# Patient Record
Sex: Male | Born: 1940 | Race: White | Hispanic: No | Marital: Married | Smoking: Former smoker
Health system: Southern US, Community
[De-identification: ages and names within clinical notes are randomized; demographics above are authoritative.]

## PROBLEM LIST (undated history)

## (undated) DIAGNOSIS — G4730 Sleep apnea, unspecified: Secondary | ICD-10-CM

## (undated) DIAGNOSIS — I10 Essential (primary) hypertension: Secondary | ICD-10-CM

## (undated) HISTORY — PX: CHOLECYSTECTOMY: SHX55

## (undated) HISTORY — PX: BACK SURGERY: SHX140

---

## 1998-04-13 ENCOUNTER — Other Ambulatory Visit: Admission: RE | Admit: 1998-04-13 | Discharge: 1998-04-13 | Payer: Self-pay

## 2004-03-07 ENCOUNTER — Ambulatory Visit (HOSPITAL_COMMUNITY): Admission: RE | Admit: 2004-03-07 | Discharge: 2004-03-07 | Payer: Self-pay | Admitting: Gastroenterology

## 2009-12-06 ENCOUNTER — Inpatient Hospital Stay (HOSPITAL_COMMUNITY): Admission: RE | Admit: 2009-12-06 | Discharge: 2009-12-08 | Payer: Self-pay | Admitting: Neurosurgery

## 2010-01-05 ENCOUNTER — Encounter: Admission: RE | Admit: 2010-01-05 | Discharge: 2010-01-05 | Payer: Self-pay | Admitting: Neurosurgery

## 2010-04-19 ENCOUNTER — Ambulatory Visit
Admission: RE | Admit: 2010-04-19 | Discharge: 2010-04-19 | Disposition: A | Discharge status: Home / Self Care | Payer: Medicare Other | Source: Ambulatory Visit | Attending: Neurosurgery | Admitting: Neurosurgery

## 2010-04-19 ENCOUNTER — Other Ambulatory Visit: Payer: Self-pay | Admitting: Neurosurgery

## 2010-04-19 DIAGNOSIS — M549 Dorsalgia, unspecified: Secondary | ICD-10-CM

## 2010-04-27 LAB — SURGICAL PCR SCREEN
MRSA, PCR: NEGATIVE
Staphylococcus aureus: POSITIVE — AB

## 2010-04-27 LAB — DIFFERENTIAL
Basophils Relative: 0 % (ref 0–1)
Eosinophils Relative: 3 % (ref 0–5)
Lymphocytes Relative: 32 % (ref 12–46)
Neutro Abs: 3.1 10*3/uL (ref 1.7–7.7)
Neutrophils Relative %: 59 % (ref 43–77)

## 2010-04-27 LAB — BASIC METABOLIC PANEL
CO2: 28 mEq/L (ref 19–32)
Calcium: 9.6 mg/dL (ref 8.4–10.5)
GFR calc Af Amer: >60 mL/min (ref >60)
GFR calc non Af Amer: >60 mL/min (ref >60)
Glucose, Bld: 96 mg/dL (ref 70–99)
Potassium: 3.7 mEq/L (ref 3.5–5.1)
Sodium: 142 mEq/L (ref 135–145)

## 2010-04-27 LAB — CBC
HCT: 43.6 % (ref 39.0–52.0)
Hemoglobin: 15.3 g/dL (ref 13.0–17.0)
MCHC: 35.1 g/dL (ref 30.0–36.0)
RBC: 4.78 MIL/uL (ref 4.22–5.81)

## 2010-04-27 LAB — TYPE AND SCREEN: Antibody Screen: NEGATIVE

## 2010-07-01 NOTE — Op Note (Signed)
NAMEDARRILL, VREELAND                   ACCOUNT NO.:  [id number]   MEDICAL RECORD NO.:  [id number]          PATIENT TYPE:  AMB   LOCATION:  ENDO                         FACILITY:  [facility]   PHYSICIAN:  Anselmo Rod, M.D.  DATE OF BIRTH:  08-07-1940   DATE OF PROCEDURE:  03/07/2004  DATE OF DISCHARGE:                                 OPERATIVE REPORT   PROCEDURE PERFORMED:  Screening colonoscopy.   ENDOSCOPIST:  Anselmo Rod, M.D.   INSTRUMENT USED:  Olympus video colonoscope.   INDICATIONS FOR PROCEDURE:  This 70 year old white male underwent screening  colonoscopy to rule out colonic polyps, masses, etc.   PRE-PROCEDURE PREPARATION:  Informed consent was procured from the patient.  The patient was fasted for eight hours prior to the procedure and prepped  with a bottle of magnesium citrate and a gallon of GoLYTELY the night prior  to the procedure.  The risks and benefits of the procedure, including a 10%  miss rate of cancer and polyps were discussed with the patient as well.   PRE-PROCEDURE PHYSICAL:  VITAL SIGNS:  The patient had stable vital signs.  NECK:  Supple.  CHEST:  Clear to auscultation.  CARDIOVASCULAR:  S1, S2 regular.  ABDOMEN:  Soft, with normal bowel sounds.   DESCRIPTION OF THE PROCEDURE:  The patient was placed in the left lateral  decubitus position, sedated with 100 mg of Demerol and 7.5 mg of Versed in  slow incremental doses.  Once the patient was adequately sedated and  maintained on low-flow oxygen and continuous cardiac monitoring, the Olympus  video colonoscope was advanced from the rectum to the cecum.  The  appendiceal orifice and the ileocecal valve were clearly visualized and  photographed.  No masses polyps, polyps, erosions, or ulcerations were  noted.  There was evidence of early sigmoid diverticulosis.  Retroflexion in  the rectum revealed prominent internal hemorrhoids, with a prominent anal  papilla.  The patient tolerated the procedure  well, without complications.   IMPRESSION:  1.  Prominent internal hemorrhoid with anal papilla seen on retroflexion.  2.  Scattered early sigmoid diverticulosis.  3.  No masses or polyps seen.   RECOMMENDATIONS:  1.  Continue a high-fiber diet, with liberal fluid intake.  2.  Repeat colonoscopy in the next five years unless the patient develops      any abnormal symptoms in the interim.  3.  Outpatient followup as need arises in the future.      JNM/MEDQ  D:  03/07/2004  T:  03/07/2004  Job:  [id number]   cc:   Olene Craven, M.D.  [address]  [address]  [place]  [place] [address]  Fax: [phone]

## 2010-12-15 ENCOUNTER — Ambulatory Visit
Admission: RE | Admit: 2010-12-15 | Discharge: 2010-12-15 | Disposition: A | Discharge status: Home / Self Care | Payer: Medicare Other | Source: Ambulatory Visit | Attending: Neurosurgery | Admitting: Neurosurgery

## 2010-12-15 ENCOUNTER — Other Ambulatory Visit: Payer: Self-pay | Admitting: Neurosurgery

## 2010-12-15 DIAGNOSIS — M545 Low back pain: Secondary | ICD-10-CM

## 2011-12-13 ENCOUNTER — Encounter (INDEPENDENT_AMBULATORY_CARE_PROVIDER_SITE_OTHER): Payer: Medicare Other | Admitting: Ophthalmology

## 2011-12-13 DIAGNOSIS — H2510 Age-related nuclear cataract, unspecified eye: Secondary | ICD-10-CM

## 2011-12-13 DIAGNOSIS — H33309 Unspecified retinal break, unspecified eye: Secondary | ICD-10-CM

## 2011-12-13 DIAGNOSIS — H35039 Hypertensive retinopathy, unspecified eye: Secondary | ICD-10-CM

## 2011-12-13 DIAGNOSIS — H43819 Vitreous degeneration, unspecified eye: Secondary | ICD-10-CM

## 2011-12-13 DIAGNOSIS — I10 Essential (primary) hypertension: Secondary | ICD-10-CM

## 2011-12-13 DIAGNOSIS — H4310 Vitreous hemorrhage, unspecified eye: Secondary | ICD-10-CM

## 2011-12-20 ENCOUNTER — Ambulatory Visit (INDEPENDENT_AMBULATORY_CARE_PROVIDER_SITE_OTHER): Payer: Medicare Other | Admitting: Ophthalmology

## 2011-12-20 DIAGNOSIS — H33309 Unspecified retinal break, unspecified eye: Secondary | ICD-10-CM

## 2012-04-11 ENCOUNTER — Ambulatory Visit (INDEPENDENT_AMBULATORY_CARE_PROVIDER_SITE_OTHER): Payer: Medicare Other | Admitting: Ophthalmology

## 2012-04-11 DIAGNOSIS — H2510 Age-related nuclear cataract, unspecified eye: Secondary | ICD-10-CM

## 2012-04-11 DIAGNOSIS — I10 Essential (primary) hypertension: Secondary | ICD-10-CM

## 2012-04-11 DIAGNOSIS — H35039 Hypertensive retinopathy, unspecified eye: Secondary | ICD-10-CM

## 2012-04-11 DIAGNOSIS — H33309 Unspecified retinal break, unspecified eye: Secondary | ICD-10-CM

## 2012-04-11 DIAGNOSIS — H43819 Vitreous degeneration, unspecified eye: Secondary | ICD-10-CM

## 2012-04-11 DIAGNOSIS — H40019 Open angle with borderline findings, low risk, unspecified eye: Secondary | ICD-10-CM

## 2012-04-18 ENCOUNTER — Ambulatory Visit (INDEPENDENT_AMBULATORY_CARE_PROVIDER_SITE_OTHER): Payer: Medicare Other | Admitting: Ophthalmology

## 2012-12-27 ENCOUNTER — Other Ambulatory Visit: Payer: Self-pay | Admitting: Specialist

## 2012-12-27 DIAGNOSIS — H547 Unspecified visual loss: Secondary | ICD-10-CM

## 2012-12-27 DIAGNOSIS — H40053 Ocular hypertension, bilateral: Secondary | ICD-10-CM

## 2013-01-07 ENCOUNTER — Ambulatory Visit
Admission: RE | Admit: 2013-01-07 | Discharge: 2013-01-07 | Disposition: A | Discharge status: Home / Self Care | Payer: Medicare Other | Source: Ambulatory Visit | Attending: Specialist | Admitting: Specialist

## 2013-01-07 DIAGNOSIS — H547 Unspecified visual loss: Secondary | ICD-10-CM

## 2013-01-07 DIAGNOSIS — H40053 Ocular hypertension, bilateral: Secondary | ICD-10-CM

## 2013-01-07 MED ORDER — GADOBENATE DIMEGLUMINE 529 MG/ML IV SOLN
20.0000 mL | Freq: Once | INTRAVENOUS | Status: AC | PRN
  Administered 2013-01-07: 20 mL via INTRAVENOUS

## 2013-03-26 IMAGING — CR DG LUMBAR SPINE 1V
2 series · 2 of 2 positions shown · non-contrast
Comparison: Prior study 04/19/2010.

CLINICAL DATA: Back pain.  History of lumbar fusion.

LUMBAR SPINE - 1 VIEW

[view not recorded (1 of 2)]
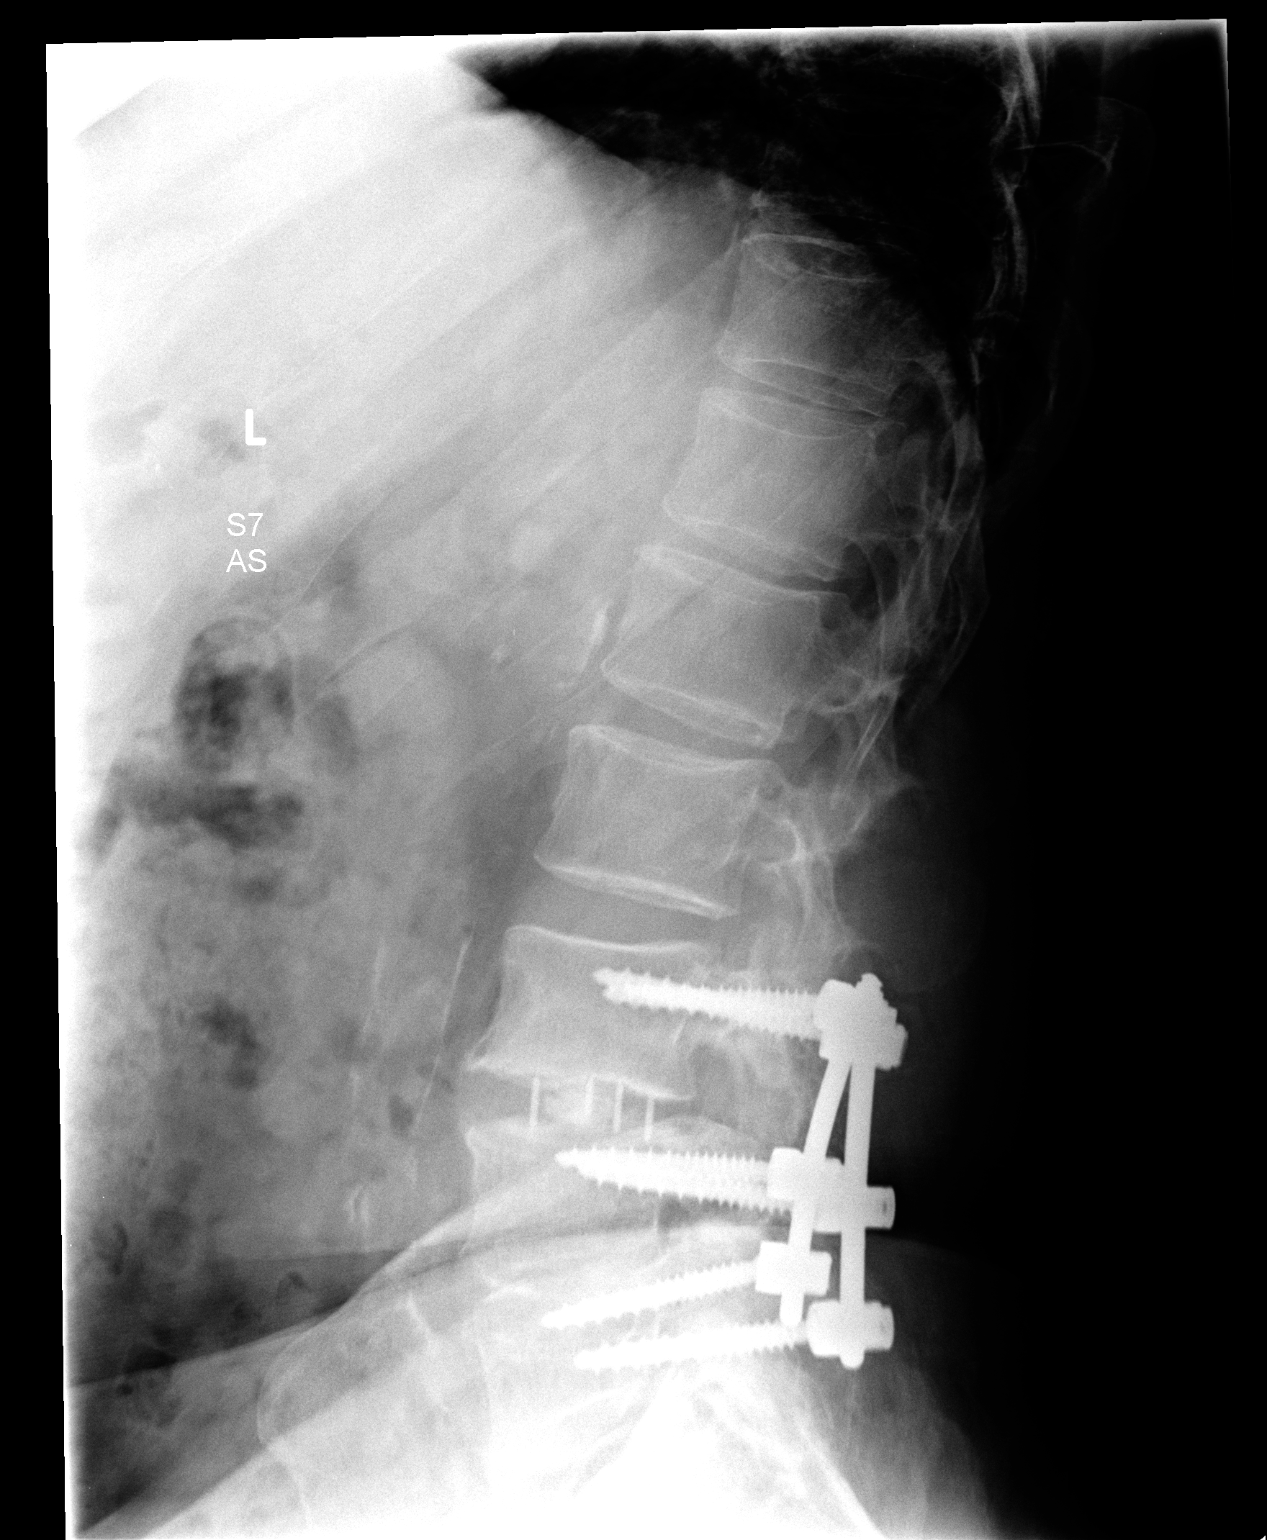

[view not recorded (2 of 2)]
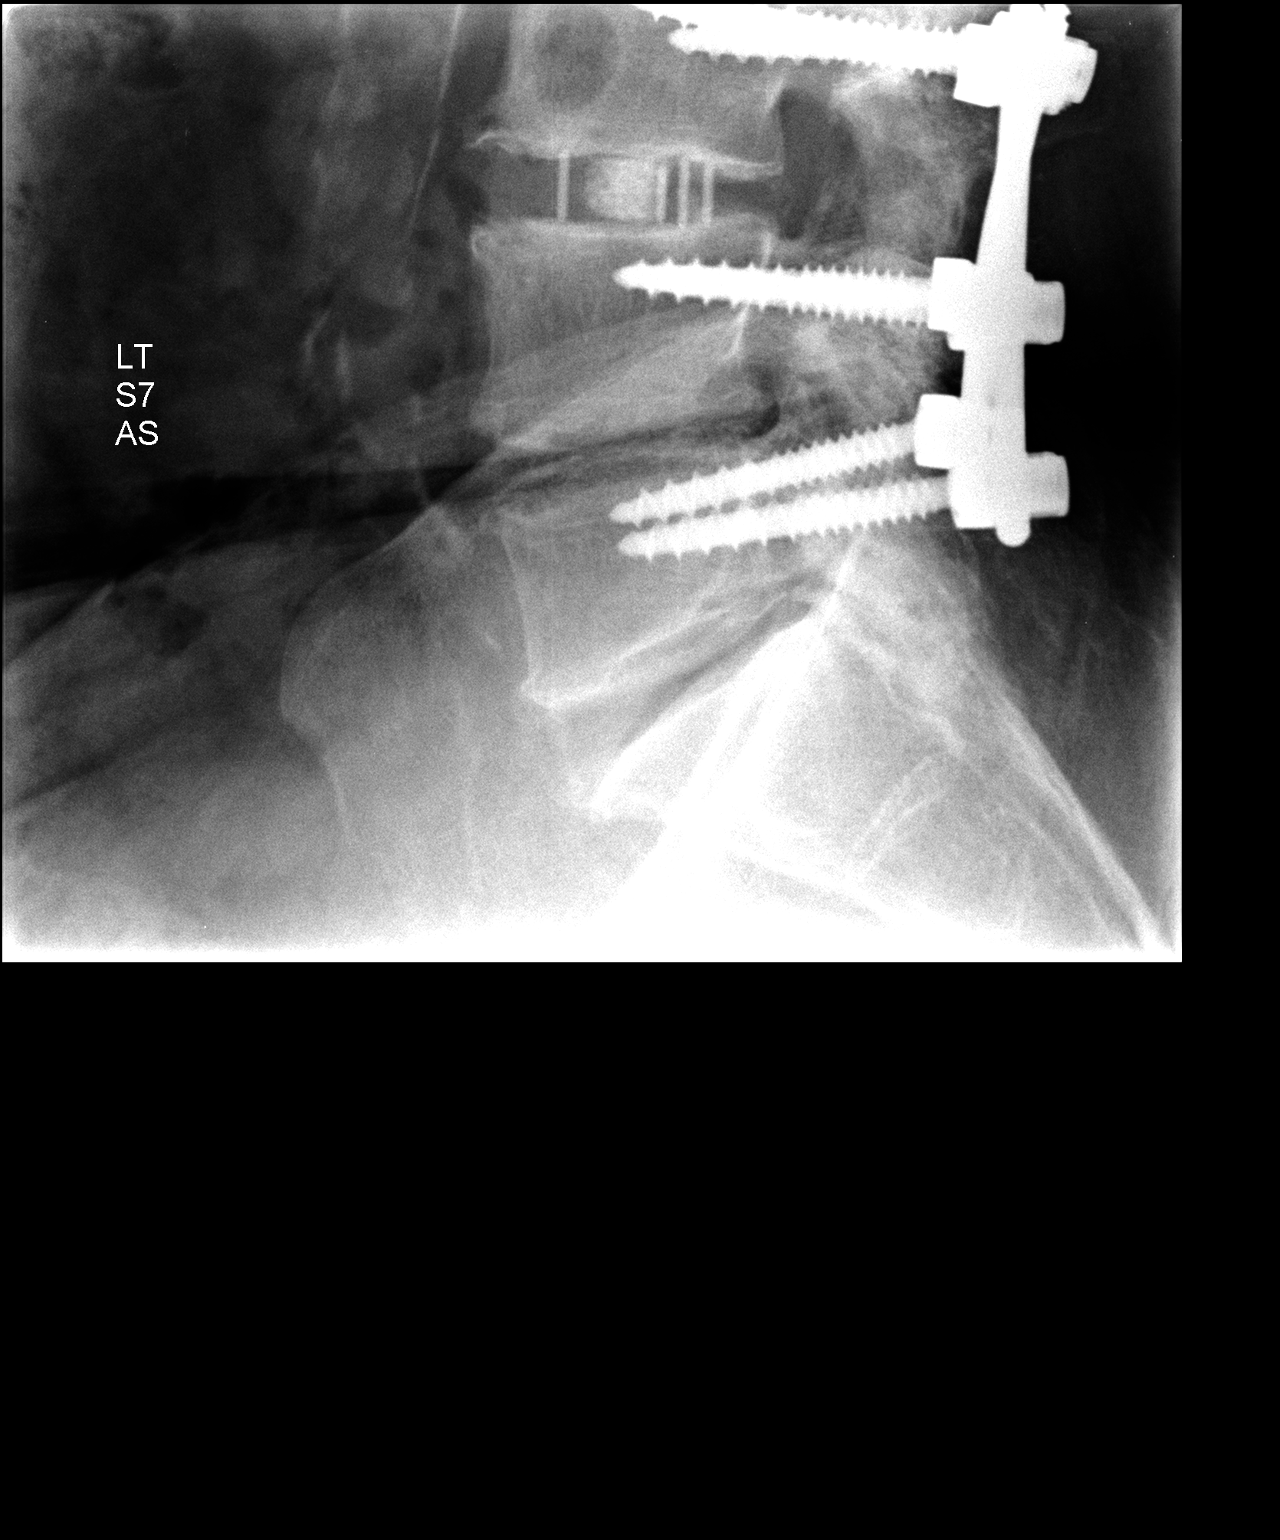

[2 of 2 positions shown; findings below may reference images not displayed]

FINDINGS: Stable appearance and position of the fusion hardware at
L3-4 and L4-5.  Interbody bone spacer noted at L3-4.  Stable
degenerative disc disease at L5-S1.
IMPRESSION: Stable position and appearance of the lumbar fusion hardware.

## 2013-10-22 DIAGNOSIS — E785 Hyperlipidemia, unspecified: Secondary | ICD-10-CM | POA: Insufficient documentation

## 2013-10-22 DIAGNOSIS — K219 Gastro-esophageal reflux disease without esophagitis: Secondary | ICD-10-CM | POA: Insufficient documentation

## 2013-10-22 DIAGNOSIS — D1801 Hemangioma of skin and subcutaneous tissue: Secondary | ICD-10-CM | POA: Insufficient documentation

## 2013-10-22 DIAGNOSIS — M810 Age-related osteoporosis without current pathological fracture: Secondary | ICD-10-CM | POA: Insufficient documentation

## 2013-10-22 DIAGNOSIS — M79673 Pain in unspecified foot: Secondary | ICD-10-CM | POA: Insufficient documentation

## 2013-10-22 DIAGNOSIS — J301 Allergic rhinitis due to pollen: Secondary | ICD-10-CM | POA: Insufficient documentation

## 2013-10-22 DIAGNOSIS — G4700 Insomnia, unspecified: Secondary | ICD-10-CM | POA: Insufficient documentation

## 2013-10-22 DIAGNOSIS — R0982 Postnasal drip: Secondary | ICD-10-CM | POA: Insufficient documentation

## 2013-10-22 DIAGNOSIS — K5790 Diverticulosis of intestine, part unspecified, without perforation or abscess without bleeding: Secondary | ICD-10-CM | POA: Insufficient documentation

## 2013-10-22 DIAGNOSIS — R351 Nocturia: Secondary | ICD-10-CM | POA: Insufficient documentation

## 2013-10-22 DIAGNOSIS — G4733 Obstructive sleep apnea (adult) (pediatric): Secondary | ICD-10-CM | POA: Insufficient documentation

## 2013-10-22 DIAGNOSIS — K8050 Calculus of bile duct without cholangitis or cholecystitis without obstruction: Secondary | ICD-10-CM | POA: Insufficient documentation

## 2013-10-22 DIAGNOSIS — B351 Tinea unguium: Secondary | ICD-10-CM | POA: Insufficient documentation

## 2013-10-22 DIAGNOSIS — D229 Melanocytic nevi, unspecified: Secondary | ICD-10-CM | POA: Insufficient documentation

## 2013-10-22 DIAGNOSIS — I10 Essential (primary) hypertension: Secondary | ICD-10-CM | POA: Insufficient documentation

## 2013-10-22 DIAGNOSIS — N400 Enlarged prostate without lower urinary tract symptoms: Secondary | ICD-10-CM | POA: Insufficient documentation

## 2013-10-22 DIAGNOSIS — B078 Other viral warts: Secondary | ICD-10-CM | POA: Insufficient documentation

## 2013-10-22 DIAGNOSIS — E559 Vitamin D deficiency, unspecified: Secondary | ICD-10-CM | POA: Insufficient documentation

## 2013-10-22 DIAGNOSIS — R5383 Other fatigue: Secondary | ICD-10-CM | POA: Insufficient documentation

## 2013-10-22 DIAGNOSIS — R972 Elevated prostate specific antigen [PSA]: Secondary | ICD-10-CM | POA: Insufficient documentation

## 2013-10-22 DIAGNOSIS — K648 Other hemorrhoids: Secondary | ICD-10-CM | POA: Insufficient documentation

## 2013-10-22 DIAGNOSIS — Z9889 Other specified postprocedural states: Secondary | ICD-10-CM | POA: Insufficient documentation

## 2013-10-22 DIAGNOSIS — K8020 Calculus of gallbladder without cholecystitis without obstruction: Secondary | ICD-10-CM | POA: Insufficient documentation

## 2013-10-22 DIAGNOSIS — J309 Allergic rhinitis, unspecified: Secondary | ICD-10-CM | POA: Insufficient documentation

## 2013-10-22 DIAGNOSIS — R350 Frequency of micturition: Secondary | ICD-10-CM | POA: Insufficient documentation

## 2014-01-24 DIAGNOSIS — S161XXA Strain of muscle, fascia and tendon at neck level, initial encounter: Secondary | ICD-10-CM | POA: Insufficient documentation

## 2014-12-29 DIAGNOSIS — H0230 Blepharochalasis unspecified eye, unspecified eyelid: Secondary | ICD-10-CM | POA: Insufficient documentation

## 2014-12-29 DIAGNOSIS — H019 Unspecified inflammation of eyelid: Secondary | ICD-10-CM | POA: Insufficient documentation

## 2014-12-29 DIAGNOSIS — H02831 Dermatochalasis of right upper eyelid: Secondary | ICD-10-CM | POA: Insufficient documentation

## 2014-12-29 DIAGNOSIS — H01003 Unspecified blepharitis right eye, unspecified eyelid: Secondary | ICD-10-CM | POA: Insufficient documentation

## 2017-05-29 DIAGNOSIS — N401 Enlarged prostate with lower urinary tract symptoms: Secondary | ICD-10-CM | POA: Insufficient documentation

## 2020-09-29 DIAGNOSIS — Z6837 Body mass index (BMI) 37.0-37.9, adult: Secondary | ICD-10-CM | POA: Insufficient documentation

## 2021-08-22 ENCOUNTER — Ambulatory Visit (HOSPITAL_BASED_OUTPATIENT_CLINIC_OR_DEPARTMENT_OTHER)
Admission: RE | Admit: 2021-08-22 | Discharge: 2021-08-22 | Disposition: A | Discharge status: Home / Self Care | Payer: Medicare PPO | Source: Ambulatory Visit | Attending: Hematology & Oncology | Admitting: Hematology & Oncology

## 2021-08-22 ENCOUNTER — Encounter: Payer: Self-pay | Admitting: Hematology & Oncology

## 2021-08-22 ENCOUNTER — Inpatient Hospital Stay (HOSPITAL_BASED_OUTPATIENT_CLINIC_OR_DEPARTMENT_OTHER): Payer: Medicare PPO | Admitting: Hematology & Oncology

## 2021-08-22 ENCOUNTER — Other Ambulatory Visit: Payer: Self-pay

## 2021-08-22 ENCOUNTER — Inpatient Hospital Stay: Payer: Medicare PPO | Attending: Hematology & Oncology

## 2021-08-22 VITALS — BP 121/66 | HR 68 | Temp 97.6°F | Resp 18 | Ht 71.0 in | Wt 263.0 lb

## 2021-08-22 DIAGNOSIS — D472 Monoclonal gammopathy: Secondary | ICD-10-CM | POA: Insufficient documentation

## 2021-08-22 DIAGNOSIS — G63 Polyneuropathy in diseases classified elsewhere: Secondary | ICD-10-CM | POA: Insufficient documentation

## 2021-08-22 DIAGNOSIS — Z79899 Other long term (current) drug therapy: Secondary | ICD-10-CM

## 2021-08-22 LAB — CBC WITH DIFFERENTIAL (CANCER CENTER ONLY)
Abs Immature Granulocytes: 0.02 10*3/uL (ref 0.00–0.07)
Basophils Absolute: 0 10*3/uL (ref 0.0–0.1)
Basophils Relative: 1 %
Eosinophils Absolute: 0.1 10*3/uL (ref 0.0–0.5)
Eosinophils Relative: 1 %
HCT: 41.7 % (ref 39.0–52.0)
Hemoglobin: 14.2 g/dL (ref 13.0–17.0)
Immature Granulocytes: 0 %
Lymphocytes Relative: 19 %
Lymphs Abs: 1.4 10*3/uL (ref 0.7–4.0)
MCH: 30.2 pg (ref 26.0–34.0)
MCHC: 34.1 g/dL (ref 30.0–36.0)
MCV: 88.7 fL (ref 80.0–100.0)
Monocytes Absolute: 0.3 10*3/uL (ref 0.1–1.0)
Monocytes Relative: 5 %
Neutro Abs: 5.3 10*3/uL (ref 1.7–7.7)
Neutrophils Relative %: 74 %
Platelet Count: 255 10*3/uL (ref 150–400)
RBC: 4.7 MIL/uL (ref 4.22–5.81)
RDW: 13.4 % (ref 11.5–15.5)
WBC Count: 7.1 10*3/uL (ref 4.0–10.5)
nRBC: 0 % (ref 0.0–0.2)

## 2021-08-22 LAB — SAMPLE TO BLOOD BANK

## 2021-08-22 LAB — CMP (CANCER CENTER ONLY)
ALT: 16 U/L (ref 0–44)
AST: 20 U/L (ref 15–41)
Albumin: 3.7 g/dL (ref 3.5–5.0)
Alkaline Phosphatase: 70 U/L (ref 38–126)
Anion gap: 10 (ref 5–15)
BUN: 19 mg/dL (ref 8–23)
CO2: 27 mmol/L (ref 22–32)
Calcium: 8.9 mg/dL (ref 8.9–10.3)
Chloride: 101 mmol/L (ref 98–111)
Creatinine: 1.26 mg/dL — ABNORMAL HIGH (ref 0.61–1.24)
GFR, Estimated: 58 mL/min — ABNORMAL LOW (ref >60)
Glucose, Bld: 154 mg/dL — ABNORMAL HIGH (ref 70–99)
Potassium: 3.2 mmol/L — ABNORMAL LOW (ref 3.5–5.1)
Sodium: 138 mmol/L (ref 135–145)
Total Bilirubin: 0.8 mg/dL (ref 0.3–1.2)
Total Protein: 7 g/dL (ref 6.5–8.1)

## 2021-08-22 LAB — LACTATE DEHYDROGENASE: LDH: 122 U/L (ref 98–192)

## 2021-08-22 NOTE — Progress Notes (Signed)
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  HPI: Paul Cardenas is an incredibly interesting 81 year old white male.  He is originally from [place].  He actually went to the [employer].  He was in the seventh graduating class at the [employer].  He was an Actuary.  After the [employer], in which she was in for about 20 years, he went to work for [employer].  He is incredibly interesting to talk to.  He is followed by Dr. Orpah Greek.  She had noted that he is complaining of some numbness in the right foot.  He has she has had right foot surgery before.  He has a pin in the right foot.  She went ahead and did some labs on him.  She ultimately did a protein electrophoresis.  There is no comment as to any type of level of abnormal protein.  However, it was felt that a immunofixation need to be done.  This was done.  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  Of course, there is levels were too low to be measurable.  He had a urine done.  The 24-hour urine I think was unremarkable.  She subsequently referred him to the [facility] for an evaluation.  He has had no problem with infections.  He has had no cough.  Is been no swallowing difficulties.  He has had no headache.  He has had no nausea or vomiting.  There is no weight loss or weight gain.  He did smoke while in the service.  He started back in 1989.  I do not think he really has a significant adult beverage use.  There is prostate issues.  He is going to have a UroLift procedure this week.  As far as he knows, there is no history of blood problems in the family.  Currently, I would have to say that his performance status is probably ECOG 1.   History reviewed. No pertinent past  medical history.:  History reviewed. No pertinent surgical history.:   Current Outpatient Medications:    amLODipine (NORVASC) 5 MG tablet, Take 5 mg by mouth daily., Disp: , Rfl:    ezetimibe (ZETIA) 10 MG tablet, Take 10 mg by mouth daily., Disp: , Rfl:    finasteride (PROSCAR) 5 MG tablet, Take 5 mg by mouth daily., Disp: , Rfl:    gabapentin (NEURONTIN) 100 MG capsule, Take 100 mg by mouth daily., Disp: , Rfl:    losartan-hydrochlorothiazide (HYZAAR) 100-25 MG tablet, Take 1 tablet by mouth daily., Disp: , Rfl:    montelukast (SINGULAIR) 10 MG tablet, Take 10 mg by mouth daily., Disp: , Rfl:    tamsulosin (FLOMAX) 0.4 MG CAPS capsule, Take 0.4 mg by mouth daily., Disp: , Rfl: :  :   Allergies  Allergen Reactions   Statins Other (See Comments)    Muscle Cramps   :  History reviewed. No pertinent family history.:   Social History   Socioeconomic History   Marital status: Married    Spouse name: Not on file   Number of children: Not on file   Years of education: Not on file   Highest education level: Not on file  Occupational History   Not  on file  Tobacco Use   Smoking status: Former    Packs/day: 1.00    Types: Cigarettes    Start date: 21    Quit date: 1989    Years since quitting: 34.5   Smokeless tobacco: Never  Substance and Sexual Activity   Alcohol use: Not on file   Drug use: Not on file   Sexual activity: Not on file  Other Topics Concern   Not on file  Social History Narrative   Not on file   Social Determinants of Health   Financial Resource Strain: Not on file  Food Insecurity: Not on file  Transportation Needs: Not on file  Physical Activity: Not on file  Stress: Not on file  Social Connections: Not on file  Intimate Partner Violence: Not on file  :  Review of Systems  Constitutional: Negative.   HENT: Negative.    Eyes: Negative.   Respiratory: Negative.    Cardiovascular: Negative.   Gastrointestinal: Negative.    Genitourinary: Negative.   Musculoskeletal: Negative.   Skin: Negative.   Neurological:  Positive for tingling.  Endo/Heme/Allergies: Negative.   Psychiatric/Behavioral: Negative.       Exam: Vital signs are temperature of 97.6.  Pulse 68.  Blood pressure 121/66.  Weight is 263 pounds. _0 @ Physical Exam Vitals reviewed.  HENT:     Head: Normocephalic and atraumatic.  Eyes:     Pupils: Pupils are equal, round, and reactive to light.  Cardiovascular:     Rate and Rhythm: Normal rate and regular rhythm.     Heart sounds: Normal heart sounds.  Pulmonary:     Effort: Pulmonary effort is normal.     Breath sounds: Normal breath sounds.  Abdominal:     General: Bowel sounds are normal.     Palpations: Abdomen is soft.  Musculoskeletal:        General: No tenderness or deformity. Normal range of motion.     Cervical back: Normal range of motion.  Lymphadenopathy:     Cervical: No cervical adenopathy.  Skin:    General: Skin is warm and dry.     Findings: No erythema or rash.  Neurological:     Mental Status: He is alert and oriented to person, place, and time.  Psychiatric:        Behavior: Behavior normal.        Thought Content: Thought content normal.        Judgment: Judgment normal.     Recent Labs    08/22/21 1305  WBC 7.1  HGB 14.2  HCT 41.7  PLT 255    Recent Labs    08/22/21 1305  NA 138  K 3.2*  CL 101  CO2 27  GLUCOSE 154*  BUN 19  CREATININE 1.26*  CALCIUM 8.9    Blood smear review: Normochromic and normocytic population of red blood cells.  There are no nucleated red blood cells.  I see no rouleaux formation.  There are no schistocytes or spherocytes.  White blood cells appear normal in morphology and maturation.  There is no immature myeloid or lymphoid forms.  There are no plasma cells.  There is no hypersegmented polys.  Platelets are adequate in number and size.  Pathology: Pending    Assessment and Plan: Paul Cardenas is a very nice  81 year old white male.  He served our country in the [employer].  I think a somewhat for service.  He was very interesting to talk to about his  time in the [employer].  I just have a hard time believing that we are going to run into a problem with myeloma or maybe even smoldering myeloma.  I would have a hard time believing that numbness in the right foot is from a monoclonal gammopathy.  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  He said he had surgery on the right foot.  As such I will know if this might be a factor.  I will have to think that he would need to have nerve conduction studies done.  This I will leave up to his family doctor.  I will get a 24-hour urine on him.  His looks like he had one done but I do still see the results that I need.  He also needs to have a bone survey done.  I will set him up with a bone survey.  Ultimately, we will have to get a bone marrow biopsy on him.  I think this will be the definitive test.  Even if we find that he has a plasma cell issue, I am is not sure that he needs to be treated.  Again I am unconvinced that this is isolated neuropathy in 1 foot is from a monoclonal gammopathy.  Again he is incredibly nice.  He was fun to talk to.  He is quite realistic.  We will plan to get him back depending on what we find with our studies and ultimately the bone marrow biopsy.

## 2021-08-23 LAB — IGG, IGA, IGM
IgA: 284 mg/dL (ref 61–437)
IgG (Immunoglobin G), Serum: 1077 mg/dL (ref 603–1613)
IgM (Immunoglobulin M), Srm: 39 mg/dL (ref 15–143)

## 2021-08-23 LAB — KAPPA/LAMBDA LIGHT CHAINS
Kappa free light chain: 26.7 mg/L — ABNORMAL HIGH (ref 3.3–19.4)
Kappa, lambda light chain ratio: 1.89 — ABNORMAL HIGH (ref 0.26–1.65)
Lambda free light chains: 14.1 mg/L (ref 5.7–26.3)

## 2021-08-23 LAB — BETA 2 MICROGLOBULIN, SERUM: Beta-2 Microglobulin: 2.4 mg/L (ref 0.6–2.4)

## 2021-08-24 ENCOUNTER — Telehealth: Payer: Self-pay

## 2021-08-24 NOTE — Telephone Encounter (Signed)
-----   Message from Volanda Napoleon, MD sent at 08/23/2021  6:21 PM EDT ----- Call -  no bone lesions that suggest myeloma!!!  Laurey Arrow

## 2021-08-24 NOTE — Telephone Encounter (Signed)
Attached messaged given to patient via phone who verbalizes understanding and appreciation:  " no bone lesions that suggest myeloma!!!"

## 2021-08-27 LAB — PROTEIN ELECTROPHORESIS, SERUM, WITH REFLEX
A/G Ratio: 1.1 (ref 0.7–1.7)
Albumin ELP: 3.4 g/dL (ref 2.9–4.4)
Alpha-1-Globulin: 0.2 g/dL (ref 0.0–0.4)
Alpha-2-Globulin: 0.8 g/dL (ref 0.4–1.0)
Beta Globulin: 1 g/dL (ref 0.7–1.3)
Gamma Globulin: 1.2 g/dL (ref 0.4–1.8)
Globulin, Total: 3.2 g/dL (ref 2.2–3.9)
M-Spike, %: 0.6 g/dL — ABNORMAL HIGH
SPEP Interpretation: 0
Total Protein ELP: 6.6 g/dL (ref 6.0–8.5)

## 2021-08-27 LAB — IMMUNOFIXATION REFLEX, SERUM
IgA: 269 mg/dL (ref 61–437)
IgG (Immunoglobin G), Serum: 1065 mg/dL (ref 603–1613)
IgM (Immunoglobulin M), Srm: 38 mg/dL (ref 15–143)

## 2021-09-19 ENCOUNTER — Ambulatory Visit (HOSPITAL_COMMUNITY): Payer: Medicare PPO

## 2021-09-26 ENCOUNTER — Inpatient Hospital Stay: Payer: Medicare PPO | Attending: Hematology & Oncology

## 2021-09-26 ENCOUNTER — Other Ambulatory Visit: Payer: Self-pay | Admitting: *Deleted

## 2021-09-26 DIAGNOSIS — G63 Polyneuropathy in diseases classified elsewhere: Secondary | ICD-10-CM | POA: Insufficient documentation

## 2021-09-26 DIAGNOSIS — D472 Monoclonal gammopathy: Secondary | ICD-10-CM | POA: Insufficient documentation

## 2021-09-27 ENCOUNTER — Inpatient Hospital Stay: Payer: Medicare PPO

## 2021-09-27 ENCOUNTER — Inpatient Hospital Stay: Payer: Medicare PPO | Admitting: Hematology & Oncology

## 2021-09-27 ENCOUNTER — Ambulatory Visit: Payer: Medicare PPO | Admitting: Hematology & Oncology

## 2021-09-28 LAB — UPEP/UIFE/LIGHT CHAINS/TP, 24-HR UR
% BETA, Urine: 0 %
ALPHA 1 URINE: 0 %
Albumin, U: 100 %
Alpha 2, Urine: 0 %
Free Kappa Lt Chains,Ur: 45.7 mg/L (ref 1.17–86.46)
Free Kappa/Lambda Ratio: 16.74 — ABNORMAL HIGH (ref 1.83–14.26)
Free Lambda Lt Chains,Ur: 2.73 mg/L (ref 0.27–15.21)
GAMMA GLOBULIN URINE: 0 %
Total Protein, Urine-Ur/day: 78 mg/24 hr (ref 30–150)
Total Protein, Urine: 6.5 mg/dL
Total Volume: 1200

## 2021-09-29 ENCOUNTER — Other Ambulatory Visit: Payer: Self-pay | Admitting: Internal Medicine

## 2021-09-29 ENCOUNTER — Other Ambulatory Visit: Payer: Self-pay | Admitting: Orthopedic Surgery

## 2021-09-29 ENCOUNTER — Telehealth: Payer: Self-pay

## 2021-09-29 DIAGNOSIS — G63 Polyneuropathy in diseases classified elsewhere: Secondary | ICD-10-CM

## 2021-09-29 DIAGNOSIS — D472 Monoclonal gammopathy: Secondary | ICD-10-CM

## 2021-09-29 NOTE — Telephone Encounter (Signed)
-----   Message from Volanda Napoleon, MD sent at 09/28/2021  5:51 PM EDT ----- Please call let him know that the urine does not show any obvious abnormal protein.  This is good news.  We will have to see what the bone marrow shows.  Thanks.  Laurey Arrow

## 2021-09-29 NOTE — Telephone Encounter (Signed)
Per Dr. Marin Olp request patient notified that the urine does not show any obvious abnormal protein. Pt aware that this is good news. Pt aware that once he has bone marrow biopsy we will reach out with results. Pt aware of upcoming appointments. Pt verbalized understanding and had no further questions.

## 2021-09-30 ENCOUNTER — Other Ambulatory Visit: Payer: Self-pay

## 2021-09-30 ENCOUNTER — Encounter (HOSPITAL_COMMUNITY): Payer: Self-pay

## 2021-09-30 ENCOUNTER — Ambulatory Visit (HOSPITAL_COMMUNITY)
Admission: RE | Admit: 2021-09-30 | Discharge: 2021-09-30 | Disposition: A | Discharge status: Home / Self Care | Payer: Medicare PPO | Source: Ambulatory Visit | Attending: Hematology & Oncology | Admitting: Hematology & Oncology

## 2021-09-30 DIAGNOSIS — G63 Polyneuropathy in diseases classified elsewhere: Secondary | ICD-10-CM

## 2021-09-30 DIAGNOSIS — D72822 Plasmacytosis: Secondary | ICD-10-CM | POA: Diagnosis not present

## 2021-09-30 DIAGNOSIS — D472 Monoclonal gammopathy: Secondary | ICD-10-CM | POA: Insufficient documentation

## 2021-09-30 DIAGNOSIS — N400 Enlarged prostate without lower urinary tract symptoms: Secondary | ICD-10-CM | POA: Insufficient documentation

## 2021-09-30 DIAGNOSIS — I10 Essential (primary) hypertension: Secondary | ICD-10-CM | POA: Diagnosis not present

## 2021-09-30 DIAGNOSIS — Z1379 Encounter for other screening for genetic and chromosomal anomalies: Secondary | ICD-10-CM | POA: Insufficient documentation

## 2021-09-30 HISTORY — DX: Essential (primary) hypertension: I10

## 2021-09-30 HISTORY — DX: Sleep apnea, unspecified: G47.30

## 2021-09-30 LAB — CBC WITH DIFFERENTIAL/PLATELET
Abs Immature Granulocytes: 0.02 10*3/uL (ref 0.00–0.07)
Basophils Absolute: 0 10*3/uL (ref 0.0–0.1)
Basophils Relative: 1 %
Eosinophils Absolute: 0.1 10*3/uL (ref 0.0–0.5)
Eosinophils Relative: 2 %
HCT: 40.7 % (ref 39.0–52.0)
Hemoglobin: 13.7 g/dL (ref 13.0–17.0)
Immature Granulocytes: 0 %
Lymphocytes Relative: 24 %
Lymphs Abs: 1.5 10*3/uL (ref 0.7–4.0)
MCH: 30 pg (ref 26.0–34.0)
MCHC: 33.7 g/dL (ref 30.0–36.0)
MCV: 89.3 fL (ref 80.0–100.0)
Monocytes Absolute: 0.3 10*3/uL (ref 0.1–1.0)
Monocytes Relative: 6 %
Neutro Abs: 4.2 10*3/uL (ref 1.7–7.7)
Neutrophils Relative %: 67 %
Platelets: 249 10*3/uL (ref 150–400)
RBC: 4.56 MIL/uL (ref 4.22–5.81)
RDW: 13.6 % (ref 11.5–15.5)
WBC: 6.2 10*3/uL (ref 4.0–10.5)
nRBC: 0 % (ref 0.0–0.2)

## 2021-09-30 MED ORDER — FLUMAZENIL 0.5 MG/5ML IV SOLN
INTRAVENOUS | Status: AC
  Filled 2021-09-30: qty 5

## 2021-09-30 MED ORDER — FENTANYL CITRATE (PF) 100 MCG/2ML IJ SOLN
INTRAMUSCULAR | Status: AC
  Filled 2021-09-30: qty 4

## 2021-09-30 MED ORDER — MIDAZOLAM HCL 2 MG/2ML IJ SOLN
INTRAMUSCULAR | Status: AC
  Filled 2021-09-30: qty 4

## 2021-09-30 MED ORDER — SODIUM CHLORIDE 0.9 % IV SOLN: INTRAVENOUS | Status: DC

## 2021-09-30 MED ORDER — FENTANYL CITRATE (PF) 100 MCG/2ML IJ SOLN
INTRAMUSCULAR | Status: AC | PRN
  Administered 2021-09-30 (×2): 50 ug via INTRAVENOUS

## 2021-09-30 MED ORDER — MIDAZOLAM HCL 5 MG/5ML IJ SOLN
INTRAMUSCULAR | Status: AC | PRN
  Administered 2021-09-30: 1 mg via INTRAVENOUS

## 2021-09-30 MED ORDER — CEFAZOLIN SODIUM-DEXTROSE 2-4 GM/100ML-% IV SOLN
2.0000 g | INTRAVENOUS | Status: AC
  Administered 2021-09-30: 2 g via INTRAVENOUS

## 2021-09-30 MED ORDER — LIDOCAINE HCL (PF) 1 % IJ SOLN
INTRAMUSCULAR | Status: AC | PRN
  Administered 2021-09-30: 20 mL via SUBCUTANEOUS

## 2021-09-30 MED ORDER — NALOXONE HCL 0.4 MG/ML IJ SOLN
INTRAMUSCULAR | Status: AC
  Filled 2021-09-30: qty 1

## 2021-09-30 MED ORDER — MIDAZOLAM HCL 2 MG/2ML IJ SOLN
INTRAMUSCULAR | Status: AC | PRN
  Administered 2021-09-30: 1 mg via INTRAVENOUS

## 2021-09-30 NOTE — Procedures (Signed)
Interventional Radiology Procedure Note  Procedure: CT guided aspirate and core biopsy of right iliac bone Complications: None Recommendations: - Bedrest supine x 1 hrs - Hydrocodone PRN  Pain - Follow biopsy results  Signed,  Brodey Bonn K. Stashia Sia, MD   

## 2021-09-30 NOTE — H&P (Signed)
Chief Complaint: Patient was seen in consultation today for monoclonal gammopathy  Referring Physician(s): Ennever,Peter R  Supervising Physician: Malachy Moan  Patient Status: [name]  History of Present Illness: Paul Cardenas is a 81 y.o. male with history of HTN, BPH who underwent recent work-up for right foot numbness.  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  Bone marrow biopsy requested by Dr. Myna Hidalgo.  Patient presents to [facility] Radiology today in his usual state of health.  Denies new complaints or concerns.  Still has right foot numbness and feels his feet are slightly swollen compared to baseline.      His friend Paul Cardenas will be assisting with transportation and recovery.   Past Medical History:  Diagnosis Date   Hypertension    Sleep apnea     Past Surgical History:  Procedure Laterality Date   CHOLECYSTECTOMY      Allergies: Statins  Medications: Prior to Admission medications   Medication Sig Start Date End Date Taking? Authorizing Provider  amLODipine (NORVASC) 5 MG tablet Take 5 mg by mouth daily. 08/20/21   [provider]  ezetimibe (ZETIA) 10 MG tablet Take 10 mg by mouth daily. 08/20/21   [provider]  finasteride (PROSCAR) 5 MG tablet Take 5 mg by mouth daily. 05/13/21   [provider]  gabapentin (NEURONTIN) 100 MG capsule Take 100 mg by mouth daily. 07/22/21   [provider]  losartan-hydrochlorothiazide (HYZAAR) 100-25 MG tablet Take 1 tablet by mouth daily. 06/13/21   [provider]  montelukast (SINGULAIR) 10 MG tablet Take 10 mg by mouth daily. 06/13/21   [provider]  tamsulosin (FLOMAX) 0.4 MG CAPS capsule Take 0.4 mg by mouth daily. 06/13/21   [provider]     History reviewed. No pertinent family history.  Social History   Socioeconomic History   Marital status:  Married    Spouse name: Not on file   Number of children: Not on file   Years of education: Not on file   Highest education level: Not on file  Occupational History   Not on file  Tobacco Use   Smoking status: Former    Packs/day: 1.00    Types: Cigarettes    Start date: 101    Quit date: 1989    Years since quitting: 34.6   Smokeless tobacco: Never  Substance and Sexual Activity   Alcohol use: Not on file   Drug use: Not on file   Sexual activity: Not on file  Other Topics Concern   Not on file  Social History Narrative   Not on file   Social Determinants of Health   Financial Resource Strain: Not on file  Food Insecurity: Not on file  Transportation Needs: Not on file  Physical Activity: Not on file  Stress: Not on file  Social Connections: Not on file     Review of Systems: A 12 point ROS discussed and pertinent positives are indicated in the HPI above.  All other systems are negative.  Review of Systems  Constitutional:  Negative for fatigue and fever.  Respiratory:  Negative for cough and shortness of breath.   Cardiovascular:  Negative for chest pain.  Gastrointestinal:  Negative for abdominal pain, nausea and vomiting.  Musculoskeletal:  Negative for back pain.  Psychiatric/Behavioral:  Negative for behavioral problems and confusion.     Vital Signs: Ht 5'  11" (1.803 m)   Wt 250 lb (113.4 kg)   BMI 34.87 kg/m   Physical Exam Vitals and nursing note reviewed.  Constitutional:      General: He is not in acute distress.    Appearance: Normal appearance. He is not ill-appearing.  HENT:     Mouth/Throat:     Mouth: Mucous membranes are moist.     Pharynx: Oropharynx is clear.  Cardiovascular:     Rate and Rhythm: Normal rate and regular rhythm.  Pulmonary:     Effort: Pulmonary effort is normal. No respiratory distress.     Breath sounds: Normal breath sounds.  Abdominal:     General: Abdomen is flat. There is no distension.     Palpations:  Abdomen is soft.  Skin:    General: Skin is warm and dry.  Neurological:     General: No focal deficit present.     Mental Status: He is alert and oriented to person, place, and time. Mental status is at baseline.  Psychiatric:        Mood and Affect: Mood normal.        Behavior: Behavior normal.        Thought Content: Thought content normal.        Judgment: Judgment normal.      MD Evaluation Airway: WNL Heart: WNL Abdomen: WNL Chest/ Lungs: WNL ASA  Classification: 3 Mallampati/Airway Score: Two   Imaging: No results found.  Labs:  CBC: Recent Labs    08/22/21 1305  WBC 7.1  HGB 14.2  HCT 41.7  PLT 255    COAGS: No results for input(s): "INR", "APTT" in the last 8760 hours.  BMP: Recent Labs    08/22/21 1305  NA 138  K 3.2*  CL 101  CO2 27  GLUCOSE 154*  BUN 19  CALCIUM 8.9  CREATININE 1.26*  GFRNONAA 58*    LIVER FUNCTION TESTS: Recent Labs    08/22/21 1305  BILITOT 0.8  AST 20  ALT 16  ALKPHOS 70  PROT 7.0  ALBUMIN 3.7    TUMOR MARKERS: No results for input(s): "AFPTM", "CEA", "CA199", "CHROMGRNA" in the last 8760 hours.  Assessment and Plan: Patient with past medical history of HTN, BPH presents with complaint of right foot numbness.  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   IR consulted for bone marrow biopsy at the request of Dr. Marin Olp. Case reviewed by Dr. Laurence Ferrari who approves patient for procedure.  Patient presents today in their usual state of health.  He has been NPO and is not currently on blood thinners.   Risks and benefits were discussed with the patient and/or patient's family including, but not limited to bleeding, infection, damage to adjacent structures or low yield requiring additional tests.  All of the questions were answered and there is agreement to proceed.  Consent signed and in chart.  Thank you for this interesting consult.  I greatly enjoyed meeting Paul Cardenas and  look forward to participating in their care.  A copy of this report was sent to the requesting provider on this date.  Electronically Signed: Docia Barrier, PA 09/30/2021, 9:58 AM   I spent a total of  30 Minutes   in face to face in clinical consultation, greater than 50% of which was counseling/coordinating care for abnormal monoclonal gammopathy

## 2021-09-30 NOTE — Discharge Instructions (Signed)
Moderate Conscious Sedation, Adult, Care After This sheet gives you information about how to care for yourself after your procedure. Your health care provider may also give you more specific instructions. If you have problems or questions, contact your health care provider. What can I expect after the procedure? After the procedure, it is common to have: Sleepiness for several hours. Impaired judgment for several hours. Difficulty with balance. Vomiting if you eat too soon. Follow these instructions at home: For the time period you were told by your health care provider:     Rest. Do not participate in activities where you could fall or become injured. Do not drive or use machinery. Do not drink alcohol. Do not take sleeping pills or medicines that cause drowsiness. Do not make important decisions or sign legal documents. Do not take care of children on your own. Eating and drinking  Follow the diet recommended by your health care provider. Drink enough fluid to keep your urine pale yellow. If you vomit: Drink water, juice, or soup when you can drink without vomiting. Make sure you have little or no nausea before eating solid foods. General instructions Take over-the-counter and prescription medicines only as told by your health care provider. Have a responsible adult stay with you for the time you are told. It is important to have someone help care for you until you are awake and alert. Do not smoke. Keep all follow-up visits as told by your health care provider. This is important. Contact a health care provider if: You are still sleepy or having trouble with balance after 24 hours. You feel light-headed. You keep feeling nauseous or you keep vomiting. You develop a rash. You have a fever. You have redness or swelling around the IV site. Get help right away if: You have trouble breathing. You have new-onset confusion at home. Summary After the procedure, it is common to  feel sleepy, have impaired judgment, or feel nauseous if you eat too soon. Rest after you get home. Know the things you should not do after the procedure. Follow the diet recommended by your health care provider and drink enough fluid to keep your urine pale yellow. Get help right away if you have trouble breathing or new-onset confusion at home. This information is not intended to replace advice given to you by your health care provider. Make sure you discuss any questions you have with your health care provider. Document Revised: 05/30/2019 Document Reviewed: 12/26/2018 Elsevier Patient Education  [place].     Bone Marrow Aspiration and Bone Marrow Biopsy, Adult, Care After This sheet gives you information about how to care for yourself after your procedure. Your health care provider may also give you more specific instructions. If you have problems or questions, contact your health careprovider. What can I expect after the procedure? After the procedure, it is common to have: Mild pain and tenderness. Swelling. Bruising. Follow these instructions at home: Puncture site care Follow instructions from your health care provider about how to take care of the puncture site. Make sure you: Wash your hands with soap and water before and after you change your bandage (dressing). If soap and water are not available, use hand sanitizer. Change your dressing as told by your health care provider. Check your puncture site every day for signs of infection. Check for: More redness, swelling, or pain. Fluid or blood. Warmth. Pus or a bad smell.  Activity Return to your normal activities as told by your health care  provider. Ask your health care provider what activities are safe for you. Do not lift anything that is heavier than 10 lb (4.5 kg), or the limit that you are told, until your health care provider says that it is safe. Do not drive for 24 hours if you were given a sedative during  your procedure. General instructions Take over-the-counter and prescription medicines only as told by your health care provider. Do not take baths, swim, or use a hot tub until your health care provider approves. Ask your health care provider if you may take showers. You may only be allowed to take sponge baths. If directed, put ice on the affected area. To do this: Put ice in a plastic bag. Place a towel between your skin and the bag. Leave the ice on for 20 minutes, 2-3 times a day. Keep all follow-up visits as told by your health care provider. This is important.  Contact a health care provider if: Your pain is not controlled with medicine. You have a fever. You have more redness, swelling, or pain around the puncture site. You have fluid or blood coming from the puncture site. Your puncture site feels warm to the touch. You have pus or a bad smell coming from the puncture site. Summary After the procedure, it is common to have mild pain, tenderness, swelling, and bruising. Follow instructions from your health care provider about how to take care of the puncture site and what activities are safe for you. Take over-the-counter and prescription medicines only as told by your health care provider. Contact a health care provider if you have any signs of infection, such as fluid or blood coming from the puncture site. This information is not intended to replace advice given to you by your health care provider. Make sure you discuss any questions you have with your healthcare provider. Document Revised: 06/18/2018 Document Reviewed: 06/18/2018 Elsevier Patient Education  2022 [employer]. For questions Rebekah Chesterfield may call   Interventional Radiology clinic [phone]   You may remove your dressing and shower tomorrow afternoon

## 2021-10-03 ENCOUNTER — Other Ambulatory Visit: Payer: Self-pay

## 2021-10-03 ENCOUNTER — Encounter (HOSPITAL_BASED_OUTPATIENT_CLINIC_OR_DEPARTMENT_OTHER): Payer: Self-pay | Admitting: Orthopedic Surgery

## 2021-10-04 LAB — SURGICAL PATHOLOGY

## 2021-10-05 ENCOUNTER — Encounter (HOSPITAL_BASED_OUTPATIENT_CLINIC_OR_DEPARTMENT_OTHER)
Admission: RE | Admit: 2021-10-05 | Discharge: 2021-10-05 | Disposition: A | Discharge status: Home / Self Care | Payer: Medicare PPO | Source: Ambulatory Visit | Attending: Orthopedic Surgery | Admitting: Orthopedic Surgery

## 2021-10-05 DIAGNOSIS — Z79899 Other long term (current) drug therapy: Secondary | ICD-10-CM | POA: Insufficient documentation

## 2021-10-05 DIAGNOSIS — Z01812 Encounter for preprocedural laboratory examination: Secondary | ICD-10-CM | POA: Insufficient documentation

## 2021-10-05 LAB — BASIC METABOLIC PANEL
Anion gap: 9 (ref 5–15)
BUN: 12 mg/dL (ref 8–23)
CO2: 25 mmol/L (ref 22–32)
Calcium: 8.8 mg/dL — ABNORMAL LOW (ref 8.9–10.3)
Chloride: 105 mmol/L (ref 98–111)
Creatinine, Ser: 1.06 mg/dL (ref 0.61–1.24)
GFR, Estimated: >60 mL/min (ref >60)
Glucose, Bld: 160 mg/dL — ABNORMAL HIGH (ref 70–99)
Potassium: 3.4 mmol/L — ABNORMAL LOW (ref 3.5–5.1)
Sodium: 139 mmol/L (ref 135–145)

## 2021-10-05 NOTE — Progress Notes (Signed)

## 2021-10-06 ENCOUNTER — Encounter (HOSPITAL_COMMUNITY): Payer: Self-pay | Admitting: Hematology & Oncology

## 2021-10-10 ENCOUNTER — Ambulatory Visit (HOSPITAL_BASED_OUTPATIENT_CLINIC_OR_DEPARTMENT_OTHER)
Admission: RE | Admit: 2021-10-10 | Discharge: 2021-10-10 | Disposition: A | Discharge status: Home / Self Care | Payer: Medicare PPO | Attending: Orthopedic Surgery | Admitting: Orthopedic Surgery

## 2021-10-10 ENCOUNTER — Encounter (HOSPITAL_COMMUNITY): Payer: Self-pay | Admitting: Hematology & Oncology

## 2021-10-10 ENCOUNTER — Encounter (HOSPITAL_BASED_OUTPATIENT_CLINIC_OR_DEPARTMENT_OTHER): Payer: Self-pay | Admitting: Orthopedic Surgery

## 2021-10-10 ENCOUNTER — Ambulatory Visit (HOSPITAL_BASED_OUTPATIENT_CLINIC_OR_DEPARTMENT_OTHER): Payer: Medicare PPO | Admitting: Anesthesiology

## 2021-10-10 ENCOUNTER — Other Ambulatory Visit: Payer: Self-pay

## 2021-10-10 ENCOUNTER — Encounter (HOSPITAL_BASED_OUTPATIENT_CLINIC_OR_DEPARTMENT_OTHER)
Admission: RE | Disposition: A | Discharge status: Home / Self Care | Payer: Self-pay | Source: Home / Self Care | Attending: Orthopedic Surgery

## 2021-10-10 DIAGNOSIS — Z87891 Personal history of nicotine dependence: Secondary | ICD-10-CM

## 2021-10-10 DIAGNOSIS — I10 Essential (primary) hypertension: Secondary | ICD-10-CM | POA: Diagnosis not present

## 2021-10-10 DIAGNOSIS — R2230 Localized swelling, mass and lump, unspecified upper limb: Secondary | ICD-10-CM | POA: Diagnosis present

## 2021-10-10 DIAGNOSIS — G4730 Sleep apnea, unspecified: Secondary | ICD-10-CM | POA: Insufficient documentation

## 2021-10-10 DIAGNOSIS — Z01818 Encounter for other preprocedural examination: Secondary | ICD-10-CM

## 2021-10-10 DIAGNOSIS — Z79899 Other long term (current) drug therapy: Secondary | ICD-10-CM

## 2021-10-10 DIAGNOSIS — S60457A Superficial foreign body of left little finger, initial encounter: Secondary | ICD-10-CM

## 2021-10-10 DIAGNOSIS — M795 Residual foreign body in soft tissue: Secondary | ICD-10-CM | POA: Insufficient documentation

## 2021-10-10 HISTORY — PX: EXCISION MASS UPPER EXTREMETIES: SHX6704

## 2021-10-10 SURGERY — EXCISION MASS UPPER EXTREMITIES
Anesthesia: Monitor Anesthesia Care | Site: Finger | Laterality: Left

## 2021-10-10 MED ORDER — 0.9 % SODIUM CHLORIDE (POUR BTL) OPTIME
TOPICAL | Status: DC | PRN
  Administered 2021-10-10: 120 mL

## 2021-10-10 MED ORDER — CEFAZOLIN SODIUM-DEXTROSE 2-4 GM/100ML-% IV SOLN
2.0000 g | Freq: Once | INTRAVENOUS | Status: AC
  Administered 2021-10-10: 2 g via INTRAVENOUS

## 2021-10-10 MED ORDER — HYDROCODONE-ACETAMINOPHEN 5-325 MG PO TABS: ORAL_TABLET | ORAL | 0 refills | Status: DC

## 2021-10-10 MED ORDER — BUPIVACAINE HCL (PF) 0.25 % IJ SOLN
INTRAMUSCULAR | Status: DC | PRN
  Administered 2021-10-10: 9 mL

## 2021-10-10 MED ORDER — FENTANYL CITRATE (PF) 250 MCG/5ML IJ SOLN
INTRAMUSCULAR | Status: DC | PRN
  Administered 2021-10-10: 50 ug via INTRAVENOUS

## 2021-10-10 MED ORDER — PROPOFOL 500 MG/50ML IV EMUL
INTRAVENOUS | Status: DC | PRN
  Administered 2021-10-10: 150 ug/kg/min via INTRAVENOUS

## 2021-10-10 MED ORDER — FENTANYL CITRATE (PF) 100 MCG/2ML IJ SOLN
INTRAMUSCULAR | Status: AC
  Filled 2021-10-10: qty 2

## 2021-10-10 MED ORDER — LACTATED RINGERS IV SOLN: INTRAVENOUS | Status: DC

## 2021-10-10 MED ORDER — CEFAZOLIN SODIUM-DEXTROSE 2-4 GM/100ML-% IV SOLN
INTRAVENOUS | Status: AC
  Filled 2021-10-10: qty 100

## 2021-10-10 MED ORDER — FENTANYL CITRATE (PF) 100 MCG/2ML IJ SOLN: 25.0000 ug | INTRAMUSCULAR | Status: DC | PRN

## 2021-10-10 MED ORDER — ONDANSETRON HCL 4 MG/2ML IJ SOLN
INTRAMUSCULAR | Status: DC | PRN
  Administered 2021-10-10: 4 mg via INTRAVENOUS

## 2021-10-10 MED ORDER — ACETAMINOPHEN 10 MG/ML IV SOLN: 1000.0000 mg | Freq: Once | INTRAVENOUS | Status: DC | PRN

## 2021-10-10 SURGICAL SUPPLY — 57 items
APL PRP STRL LF DISP 70% ISPRP (MISCELLANEOUS) ×2
APL SKNCLS STERI-STRIP NONHPOA (GAUZE/BANDAGES/DRESSINGS)
BANDAGE GAUZE 1X75IN STRL (MISCELLANEOUS) IMPLANT
BENZOIN TINCTURE PRP APPL 2/3 (GAUZE/BANDAGES/DRESSINGS) IMPLANT
BLADE MINI RND TIP GREEN BEAV (BLADE) IMPLANT
BLADE SURG 15 STRL LF DISP TIS (BLADE) ×4 IMPLANT
BLADE SURG 15 STRL SS (BLADE) ×4
BNDG CMPR 5X2 CHSV 1 LYR STRL (GAUZE/BANDAGES/DRESSINGS)
BNDG CMPR 75X11 PLY HI ABS (MISCELLANEOUS)
BNDG CMPR 75X21 PLY HI ABS (MISCELLANEOUS)
BNDG CMPR 9X4 STRL LF SNTH (GAUZE/BANDAGES/DRESSINGS) ×2
BNDG COHESIVE 1X5 TAN STRL LF (GAUZE/BANDAGES/DRESSINGS) ×1 IMPLANT
BNDG COHESIVE 2X5 TAN ST LF (GAUZE/BANDAGES/DRESSINGS) IMPLANT
BNDG ELASTIC 2X5.8 VLCR STR LF (GAUZE/BANDAGES/DRESSINGS) IMPLANT
BNDG ELASTIC 3X5.8 VLCR STR LF (GAUZE/BANDAGES/DRESSINGS) IMPLANT
BNDG ESMARK 4X9 LF (GAUZE/BANDAGES/DRESSINGS) ×1 IMPLANT
BNDG GAUZE 1X75IN STRL (MISCELLANEOUS)
BNDG GAUZE DERMACEA FLUFF 4 (GAUZE/BANDAGES/DRESSINGS) IMPLANT
BNDG GZE DERMACEA 4 6PLY (GAUZE/BANDAGES/DRESSINGS)
BNDG PLASTER X FAST 3X3 WHT LF (CAST SUPPLIES) IMPLANT
BNDG PLSTR 9X3 FST ST WHT (CAST SUPPLIES)
CHLORAPREP W/TINT 26 (MISCELLANEOUS) ×2 IMPLANT
CORD BIPOLAR FORCEPS 12FT (ELECTRODE) ×2 IMPLANT
COVER BACK TABLE 60X90IN (DRAPES) ×2 IMPLANT
COVER MAYO STAND STRL (DRAPES) ×2 IMPLANT
CUFF TOURN SGL QUICK 18X4 (TOURNIQUET CUFF) ×2 IMPLANT
DRAPE EXTREMITY T 121X128X90 (DISPOSABLE) ×2 IMPLANT
DRAPE SURG 17X23 STRL (DRAPES) ×2 IMPLANT
GAUZE SPONGE 4X4 12PLY STRL (GAUZE/BANDAGES/DRESSINGS) ×2 IMPLANT
GAUZE STRETCH 2X75IN STRL (MISCELLANEOUS) IMPLANT
GAUZE XEROFORM 1X8 LF (GAUZE/BANDAGES/DRESSINGS) ×2 IMPLANT
GLOVE BIO SURGEON STRL SZ7.5 (GLOVE) ×2 IMPLANT
GLOVE BIOGEL PI IND STRL 8 (GLOVE) ×2 IMPLANT
GLOVE BIOGEL PI INDICATOR 8 (GLOVE) ×2
GLOVE INDICATOR 7.0 STRL GRN (GLOVE) ×1 IMPLANT
GLOVE INDICATOR 7.5 STRL GRN (GLOVE) ×1 IMPLANT
GOWN STRL REUS W/ TWL LRG LVL3 (GOWN DISPOSABLE) ×2 IMPLANT
GOWN STRL REUS W/TWL LRG LVL3 (GOWN DISPOSABLE) ×2
GOWN STRL REUS W/TWL XL LVL3 (GOWN DISPOSABLE) ×2 IMPLANT
NDL HYPO 25X1 1.5 SAFETY (NEEDLE) ×1 IMPLANT
NEEDLE HYPO 25X1 1.5 SAFETY (NEEDLE) ×2 IMPLANT
NS IRRIG 1000ML POUR BTL (IV SOLUTION) ×2 IMPLANT
PACK BASIN DAY SURGERY FS (CUSTOM PROCEDURE TRAY) ×2 IMPLANT
PAD CAST 3X4 CTTN HI CHSV (CAST SUPPLIES) IMPLANT
PAD CAST 4YDX4 CTTN HI CHSV (CAST SUPPLIES) IMPLANT
PADDING CAST ABS COTTON 4X4 ST (CAST SUPPLIES) ×2 IMPLANT
PADDING CAST COTTON 3X4 STRL (CAST SUPPLIES)
PADDING CAST COTTON 4X4 STRL (CAST SUPPLIES)
STOCKINETTE 4X48 STRL (DRAPES) ×2 IMPLANT
STRIP CLOSURE SKIN 1/2X4 (GAUZE/BANDAGES/DRESSINGS) IMPLANT
SUT ETHILON 3 0 PS 1 (SUTURE) IMPLANT
SUT ETHILON 4 0 PS 2 18 (SUTURE) ×1 IMPLANT
SUT MON AB 5-0 P3 18 (SUTURE) ×1 IMPLANT
SYR BULB EAR ULCER 3OZ GRN STR (SYRINGE) ×2 IMPLANT
SYR CONTROL 10ML LL (SYRINGE) ×2 IMPLANT
TOWEL GREEN STERILE FF (TOWEL DISPOSABLE) ×4 IMPLANT
UNDERPAD 30X36 HEAVY ABSORB (UNDERPADS AND DIAPERS) ×2 IMPLANT

## 2021-10-10 NOTE — Op Note (Signed)
NAME: Paul Cardenas MEDICAL RECORD NO: [id number] DATE OF BIRTH: 19-Feb-1940 FACILITY: [facility] LOCATION: [facility] PHYSICIAN: Tennis Must, MD   OPERATIVE REPORT   DATE OF PROCEDURE: 10/10/21    PREOPERATIVE DIAGNOSIS: Left small finger mass/retained foreign body   POSTOPERATIVE DIAGNOSIS: Left small finger retained foreign body   PROCEDURE: Removal left small finger retained foreign body   SURGEON:  Leanora Cover, M.D.   ASSISTANT: none   ANESTHESIA:  Local with sedation   INTRAVENOUS FLUIDS:  Per anesthesia flow sheet.   ESTIMATED BLOOD LOSS:  Minimal.   COMPLICATIONS:  None.   SPECIMENS:  none   TOURNIQUET TIME:    Total Tourniquet Time Documented: Forearm (Left) - 9 minutes Total: Forearm (Left) - 9 minutes    DISPOSITION:  Stable to PACU.   INDICATIONS: 81 year old male states he sustained an injury to the left small finger.  He feels there is some retained material in here or a mass in the finger.  It is bothersome to him.  He wishes to have it removed.  Risks, benefits and alternatives of surgery were discussed including the risks of blood loss, infection, damage to nerves, vessels, tendons, ligaments, bone for surgery, need for additional surgery, complications with wound healing, continued pain, stiffness, retained foreign body.  He voiced understanding of these risks and elected to proceed.  OPERATIVE COURSE:  After being identified preoperatively by myself,  the patient and I agreed on the procedure and site of the procedure.  The surgical site was marked.  Surgical consent had been signed. Preoperative IV antibiotic prophylaxis was given. He was transferred to the operating room and placed on the operating table in supine position with the Left upper extremity on an arm board.  Sedation was induced by the anesthesiologist.a surgical pause was performed between the surgeons anesthesia and operating room staff and all were in agreement as the patient  procedure and site procedure.  A digital block was performed to the left small finger with quarter percent plain Marcaine.  Left upper extremity was prepped and draped in normal sterile orthopedic fashion.  A surgical pause was performed between the surgeons, anesthesia, and operating room staff and all were in agreement as to the patient, procedure, and site of procedure.  Tourniquet at the proximal aspect of the forearm was inflated to 250 mmHg after exsanguination of the arm with an Esmarch bandage.  Incision was made in the pad of the finger over the palpable mass.  This was carried in subcutaneous tissues by spreading technique.  A dark foreign body was encountered.  This was removed.  It appeared initially like a pencil lead but was very friable.  It seemed as though it may have been a piece of wood.  The wound was inspected.  No more retained foreign body was noted.  The area was palpated and no mass was palpable.  The wound was copiously irrigated with sterile saline and closed with 5-0 Monocryl suture in a horizontal mattress fashion.  It was dressed with sterile Xeroform and 4 x 4 and wrapped with a Coban dressing lightly.  The tourniquet was deflated at 9 minutes.  Fingertips were pink with brisk capillary refill after deflation of tourniquet.  The operative  drapes were broken down.  The patient was awoken from anesthesia safely.  He was transferred back to the stretcher and taken to PACU in stable condition.  I will see him back in the office in 1 week for postoperative followup.  I will give him a prescription for Norco 5/325 1-2 tabs PO q6 hours prn pain, dispense #10.   Leanora Cover, MD Electronically signed, 10/10/21

## 2021-10-10 NOTE — Discharge Instructions (Addendum)

## 2021-10-10 NOTE — H&P (Signed)
Paul Cardenas is an 81 y.o. male.   Chief Complaint: finger mass HPI: 81 yo male with right small finger mass vs foreign body.  It is bothersome to him.  He wishes to have it removed.  Allergies:  Allergies  Allergen Reactions   Statins Other (See Comments)    Muscle Cramps     Past Medical History:  Diagnosis Date   Hypertension    Sleep apnea     Past Surgical History:  Procedure Laterality Date   BACK SURGERY     CHOLECYSTECTOMY      Family History: History reviewed. No pertinent family history.  Social History:   reports that he quit smoking about 34 years ago. His smoking use included cigarettes. He started smoking about 61 years ago. He smoked an average of 1 pack per day. He has never used smokeless tobacco. He reports that he does not use drugs. No history on file for alcohol use.  Medications: No medications prior to admission.    No results found for this or any previous visit (from the past 48 hour(s)).  No results found.    There were no vitals taken for this visit.  General appearance: alert, cooperative, and appears stated age Head: Normocephalic, without obvious abnormality, atraumatic Neck: supple, symmetrical, trachea midline Extremities: Intact sensation and capillary refill all digits.  +epl/fpl/io.  No wounds.  Pulses: 2+ and symmetric Skin: Skin color, texture, turgor normal. No rashes or lesions Neurologic: Grossly normal Incision/Wound: none  Assessment/Plan Right small finger mass/foreign body.  Non operative and operative treatment options have been discussed with the patient and patient wishes to proceed with operative treatment. Risks, benefits, and alternatives of surgery have been discussed and the patient agrees with the plan of care.   Leanora Cover 10/10/2021, 12:07 PM

## 2021-10-10 NOTE — Anesthesia Preprocedure Evaluation (Signed)
Anesthesia Evaluation  Patient identified by MRN, date of birth, ID band Patient awake    Reviewed: Allergy & Precautions, NPO status , Patient's Chart, lab work & pertinent test results  Airway Mallampati: II  TM Distance: >3 FB Neck ROM: Full    Dental no notable dental hx.    Pulmonary sleep apnea , Patient abstained from smoking., former smoker,    Pulmonary exam normal        Cardiovascular hypertension, Pt. on medications  Rhythm:Regular Rate:Normal     Neuro/Psych negative neurological ROS  negative psych ROS   GI/Hepatic Neg liver ROS, GERD  ,  Endo/Other  negative endocrine ROS  Renal/GU negative Renal ROS  negative genitourinary   Musculoskeletal Foreign body finger   Abdominal Normal abdominal exam  (+)   Peds  Hematology negative hematology ROS (+)   Anesthesia Other Findings   Reproductive/Obstetrics                             Anesthesia Physical Anesthesia Plan  ASA: 2  Anesthesia Plan: MAC   Post-op Pain Management:    Induction: Intravenous  PONV Risk Score and Plan: 1 and Ondansetron, Dexamethasone and Treatment may vary due to age or medical condition  Airway Management Planned: Simple Face Mask, Natural Airway and Nasal Cannula  Additional Equipment: None  Intra-op Plan:   Post-operative Plan:   Informed Consent: I have reviewed the patients History and Physical, chart, labs and discussed the procedure including the risks, benefits and alternatives for the proposed anesthesia with the patient or authorized representative who has indicated his/her understanding and acceptance.     Dental advisory given  Plan Discussed with: CRNA  Anesthesia Plan Comments:         Anesthesia Quick Evaluation

## 2021-10-10 NOTE — Anesthesia Procedure Notes (Signed)
Procedure Name: MAC Date/Time: 10/10/2021 3:47 PM  Performed by: Verita Lamb, CRNAPatient Re-evaluated:Patient Re-evaluated prior to induction Oxygen Delivery Method: Simple face mask Placement Confirmation: positive ETCO2, CO2 detector and breath sounds checked- equal and bilateral

## 2021-10-10 NOTE — OR Nursing (Signed)
Foreign body given to patient per request from Dr. Fredna Dow

## 2021-10-10 NOTE — Transfer of Care (Signed)
Immediate Anesthesia Transfer of Care Note  Patient: Paul Cardenas  Procedure(s) Performed: EXCISION FOREIGN BODY LEFT SMALL FINGER PULP (Left: Finger)  Patient Location: PACU  Anesthesia Type:MAC  Level of Consciousness: awake, alert  and patient cooperative  Airway & Oxygen Therapy: Patient Spontanous Breathing  Post-op Assessment: Report given to RN and Post -op Vital signs reviewed and stable  Post vital signs: Reviewed and stable  Last Vitals:  Vitals Value Taken Time  BP 144/66 10/10/21 1610  Temp    Pulse 64 10/10/21 1611  Resp 16 10/10/21 1611  SpO2 97 % 10/10/21 1611  Vitals shown include unvalidated device data.  Last Pain:  Vitals:   10/10/21 1245  TempSrc: Oral  PainSc: 0-No pain      Patients Stated Pain Goal: 5 (70/34/03 5248)  Complications: No notable events documented.

## 2021-10-11 ENCOUNTER — Encounter (HOSPITAL_BASED_OUTPATIENT_CLINIC_OR_DEPARTMENT_OTHER): Payer: Self-pay | Admitting: Orthopedic Surgery

## 2021-10-11 NOTE — Anesthesia Postprocedure Evaluation (Signed)
Anesthesia Post Note  Patient: Paul Cardenas  Procedure(s) Performed: EXCISION FOREIGN BODY LEFT SMALL FINGER PULP (Left: Finger)     Patient location during evaluation: PACU Anesthesia Type: MAC Level of consciousness: awake and alert Pain management: pain level controlled Vital Signs Assessment: post-procedure vital signs reviewed and stable Respiratory status: spontaneous breathing, nonlabored ventilation, respiratory function stable and patient connected to nasal cannula oxygen Cardiovascular status: stable and blood pressure returned to baseline Postop Assessment: no apparent nausea or vomiting Anesthetic complications: no   No notable events documented.  Last Vitals:  Vitals:   10/10/21 1616 10/10/21 1628  BP: (!) 127/59 130/63  Pulse: 63 60  Resp: 12 16  Temp:  (!) 36.2 C  SpO2: 96% 95%    Last Pain:  Vitals:   10/10/21 1628  TempSrc:   PainSc: 0-No pain                 Belenda Cruise P Shenelle Klas

## 2021-10-18 ENCOUNTER — Inpatient Hospital Stay (HOSPITAL_BASED_OUTPATIENT_CLINIC_OR_DEPARTMENT_OTHER): Payer: Medicare PPO | Admitting: Hematology & Oncology

## 2021-10-18 ENCOUNTER — Other Ambulatory Visit: Payer: Self-pay

## 2021-10-18 ENCOUNTER — Inpatient Hospital Stay: Payer: Medicare PPO | Attending: Hematology & Oncology

## 2021-10-18 ENCOUNTER — Encounter: Payer: Self-pay | Admitting: Hematology & Oncology

## 2021-10-18 VITALS — BP 141/63 | HR 64 | Temp 97.6°F | Resp 18 | Wt 265.0 lb

## 2021-10-18 DIAGNOSIS — D472 Monoclonal gammopathy: Secondary | ICD-10-CM | POA: Diagnosis present

## 2021-10-18 DIAGNOSIS — Z79899 Other long term (current) drug therapy: Secondary | ICD-10-CM | POA: Diagnosis not present

## 2021-10-18 DIAGNOSIS — G629 Polyneuropathy, unspecified: Secondary | ICD-10-CM | POA: Diagnosis not present

## 2021-10-18 HISTORY — DX: Monoclonal gammopathy: D47.2

## 2021-10-18 LAB — CBC WITH DIFFERENTIAL (CANCER CENTER ONLY)
Abs Immature Granulocytes: 0.01 10*3/uL (ref 0.00–0.07)
Basophils Absolute: 0.1 10*3/uL (ref 0.0–0.1)
Basophils Relative: 1 %
Eosinophils Absolute: 0.1 10*3/uL (ref 0.0–0.5)
Eosinophils Relative: 2 %
HCT: 42.9 % (ref 39.0–52.0)
Hemoglobin: 14.4 g/dL (ref 13.0–17.0)
Immature Granulocytes: 0 %
Lymphocytes Relative: 27 %
Lymphs Abs: 1.7 10*3/uL (ref 0.7–4.0)
MCH: 29.6 pg (ref 26.0–34.0)
MCHC: 33.6 g/dL (ref 30.0–36.0)
MCV: 88.3 fL (ref 80.0–100.0)
Monocytes Absolute: 0.4 10*3/uL (ref 0.1–1.0)
Monocytes Relative: 7 %
Neutro Abs: 4.2 10*3/uL (ref 1.7–7.7)
Neutrophils Relative %: 63 %
Platelet Count: 286 10*3/uL (ref 150–400)
RBC: 4.86 MIL/uL (ref 4.22–5.81)
RDW: 13.6 % (ref 11.5–15.5)
WBC Count: 6.5 10*3/uL (ref 4.0–10.5)
nRBC: 0 % (ref 0.0–0.2)

## 2021-10-18 LAB — COMPREHENSIVE METABOLIC PANEL
ALT: 15 U/L (ref 0–44)
AST: 19 U/L (ref 15–41)
Albumin: 3.7 g/dL (ref 3.5–5.0)
Alkaline Phosphatase: 71 U/L (ref 38–126)
Anion gap: 10 (ref 5–15)
BUN: 17 mg/dL (ref 8–23)
CO2: 25 mmol/L (ref 22–32)
Calcium: 8.7 mg/dL — ABNORMAL LOW (ref 8.9–10.3)
Chloride: 103 mmol/L (ref 98–111)
Creatinine, Ser: 1.16 mg/dL (ref 0.61–1.24)
GFR, Estimated: >60 mL/min (ref >60)
Glucose, Bld: 111 mg/dL — ABNORMAL HIGH (ref 70–99)
Potassium: 3.3 mmol/L — ABNORMAL LOW (ref 3.5–5.1)
Sodium: 138 mmol/L (ref 135–145)
Total Bilirubin: 0.6 mg/dL (ref 0.3–1.2)
Total Protein: 7.2 g/dL (ref 6.5–8.1)

## 2021-10-18 LAB — LACTATE DEHYDROGENASE: LDH: 120 U/L (ref 98–192)

## 2021-10-18 NOTE — Progress Notes (Signed)
Hematology and Oncology Follow Up Visit  Paul Cardenas [id number] 06-09-1940 81 y.o. 10/18/2021   Principle Diagnosis:  IgG Kappa MGUS  Current Therapy:   Observation     Interim History:  Paul Cardenas is back for follow-up.  This is his second office visit.  We first saw him back in July.  At that time, I thought that we need to do a bone marrow biopsy to know exactly what was going on.  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  I really believe this is more anatomical than anything related to a monoclonal gammopathy.  He has had back surgery before.  He has had sciatica.  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  I encouraged him to see his neurosurgeon.  Had a bone marrow biopsy done on 09/30/2021.  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  He had normal cytogenetics.  Had normal FISH panel.  We did do a 24-hour urine on him.  There was no monoclonal protein in the urine.  On first saw him, his monoclonal spike was only 0.6 g/dL.  His IgG level was 1070 mg/dL.  The Kappa light chain was 2.7 mg/dL.  There is been no problems with fever.  He has had no infections.  He has had no change in bowel or bladder habits.  He has had a little bit of leg swelling which is chronic.  He is on amlodipine.  He has had no cough or shortness of breath.  Overall, I would say his performance status is probably ECOG 1.  Medications:  Current Outpatient Medications:    amLODipine (NORVASC) 5 MG tablet, Take 5 mg by mouth daily., Disp: , Rfl:    doxylamine, Sleep, (UNISOM SLEEPTABS) 25 MG tablet, Take by mouth., Disp: , Rfl:    ezetimibe (ZETIA) 10 MG tablet, Take 10 mg by mouth daily., Disp: , Rfl:    gabapentin (NEURONTIN) 100 MG capsule, Take 100 mg by mouth daily., Disp: , Rfl:    HYDROcodone-acetaminophen (NORCO/VICODIN) 5-325 MG tablet, 1-2 tabs PO q6 hours prn pain, Disp: 10 tablet, Rfl: 0   losartan-hydrochlorothiazide (HYZAAR) 100-25 MG tablet,  Take 1 tablet by mouth daily., Disp: , Rfl:    montelukast (SINGULAIR) 10 MG tablet, Take 10 mg by mouth daily., Disp: , Rfl:   Allergies:  Allergies  Allergen Reactions   Statins Other (See Comments)    Muscle Cramps     Past Medical History, Surgical history, Social history, and Family History were reviewed and updated.  Review of Systems: Review of Systems  Constitutional: Negative.   HENT:  Negative.    Eyes: Negative.   Respiratory: Negative.    Cardiovascular: Negative.   Gastrointestinal: Negative.   Endocrine: Negative.   Genitourinary: Negative.    Musculoskeletal:  Positive for arthralgias and back pain.  Skin: Negative.   Neurological:  Positive for numbness.  Hematological: Negative.   Psychiatric/Behavioral: Negative.      Physical Exam:  weight is 265 lb (120.2 kg). His oral temperature is 97.6 F (36.4 C). His blood pressure is 141/63 (abnormal) and his pulse is 64. His respiration is 18 and oxygen saturation is 95%.   Wt Readings from Last 3 Encounters:  10/18/21 265 lb (120.2 kg)  10/10/21 264 lb 8.8 oz (120 kg)  09/30/21 250 lb (113.4 kg)    Physical Exam Vitals reviewed.  HENT:     Head: Normocephalic and atraumatic.  Eyes:     Pupils: Pupils  are equal, round, and reactive to light.  Cardiovascular:     Rate and Rhythm: Normal rate and regular rhythm.     Heart sounds: Normal heart sounds.  Pulmonary:     Effort: Pulmonary effort is normal.     Breath sounds: Normal breath sounds.  Abdominal:     General: Bowel sounds are normal.     Palpations: Abdomen is soft.  Musculoskeletal:        General: No tenderness or deformity. Normal range of motion.     Cervical back: Normal range of motion.  Lymphadenopathy:     Cervical: No cervical adenopathy.  Skin:    General: Skin is warm and dry.     Findings: No erythema or rash.  Neurological:     Mental Status: He is alert and oriented to person, place, and time.  Psychiatric:         Behavior: Behavior normal.        Thought Content: Thought content normal.        Judgment: Judgment normal.      Lab Results  Component Value Date   WBC 6.5 10/18/2021   HGB 14.4 10/18/2021   HCT 42.9 10/18/2021   MCV 88.3 10/18/2021   PLT 286 10/18/2021     Chemistry      Component Value Date/Time   NA 138 10/18/2021 1427   K 3.3 (L) 10/18/2021 1427   CL 103 10/18/2021 1427   CO2 25 10/18/2021 1427   BUN 17 10/18/2021 1427   CREATININE 1.16 10/18/2021 1427   CREATININE 1.26 (H) 08/22/2021 1305      Component Value Date/Time   CALCIUM 8.7 (L) 10/18/2021 1427   ALKPHOS 71 10/18/2021 1427   AST 19 10/18/2021 1427   AST 20 08/22/2021 1305   ALT 15 10/18/2021 1427   ALT 16 08/22/2021 1305   BILITOT 0.6 10/18/2021 1427   BILITOT 0.8 08/22/2021 1305      Impression and Plan: Paul Cardenas is a very nice 81 year old white male.  I think he has an IgG kappa MGUS.  Again I do not believe that this is anything related to this neuropathy that he has over on the right leg.  Again, I really think he needs to have an MRI of the lower back.  His neurosurgeon should be the one who should be able to help manage this.  I think that from an MGUS point of view, we can probably get him back to see [place] in another 6 months.  I do not see that we have do any additional studies on him.  He clearly does not need to have any intervention with treatment.  For right now, I thank him for his service to our country.  He was in the [employer].  He certainly sacrifice himself for [place] and we can certainly try to help him have a better quality of life.   Volanda Napoleon, MD 9/5/20234:52 PM

## 2021-10-19 LAB — KAPPA/LAMBDA LIGHT CHAINS
Kappa free light chain: 25.8 mg/L — ABNORMAL HIGH (ref 3.3–19.4)
Kappa, lambda light chain ratio: 2.11 — ABNORMAL HIGH (ref 0.26–1.65)
Lambda free light chains: 12.2 mg/L (ref 5.7–26.3)

## 2021-10-19 LAB — IGG, IGA, IGM
IgA: 293 mg/dL (ref 61–437)
IgG (Immunoglobin G), Serum: 1086 mg/dL (ref 603–1613)
IgM (Immunoglobulin M), Srm: 41 mg/dL (ref 15–143)

## 2021-10-19 LAB — BETA 2 MICROGLOBULIN, SERUM: Beta-2 Microglobulin: 2.4 mg/L (ref 0.6–2.4)

## 2021-10-24 LAB — PROTEIN ELECTROPHORESIS, SERUM, WITH REFLEX
A/G Ratio: 1.2 (ref 0.7–1.7)
Albumin ELP: 3.6 g/dL (ref 2.9–4.4)
Alpha-1-Globulin: 0.2 g/dL (ref 0.0–0.4)
Alpha-2-Globulin: 0.7 g/dL (ref 0.4–1.0)
Beta Globulin: 0.9 g/dL (ref 0.7–1.3)
Gamma Globulin: 1.1 g/dL (ref 0.4–1.8)
Globulin, Total: 3 g/dL (ref 2.2–3.9)
M-Spike, %: 0.5 g/dL — ABNORMAL HIGH
SPEP Interpretation: 0
Total Protein ELP: 6.6 g/dL (ref 6.0–8.5)

## 2021-10-24 LAB — IMMUNOFIXATION REFLEX, SERUM
IgA: 296 mg/dL (ref 61–437)
IgG (Immunoglobin G), Serum: 1071 mg/dL (ref 603–1613)
IgM (Immunoglobulin M), Srm: 42 mg/dL (ref 15–143)

## 2021-10-25 ENCOUNTER — Telehealth: Payer: Self-pay | Admitting: *Deleted

## 2021-10-25 NOTE — Telephone Encounter (Signed)
As noted below by Dr. Marin Olp, I informed the patient that the protein levels are still nice and low. I do not see that we have a problem at all. Return in six months. Appointment scheduled for March. He verbalized understanding.

## 2021-10-25 NOTE — Telephone Encounter (Signed)
-----   Message from Volanda Napoleon, MD sent at 10/24/2021  4:33 PM EDT ----- Call and let him know that the protein levels are still nice and low.  I still do not see that we have a problem at all.  I think we see him back in 6 months.  Thanks.  Laurey Arrow

## 2021-10-27 LAB — SURGICAL PATHOLOGY

## 2022-04-19 ENCOUNTER — Inpatient Hospital Stay (HOSPITAL_BASED_OUTPATIENT_CLINIC_OR_DEPARTMENT_OTHER): Payer: Medicare PPO | Admitting: Physician Assistant

## 2022-04-19 ENCOUNTER — Encounter: Payer: Self-pay | Admitting: Physician Assistant

## 2022-04-19 ENCOUNTER — Inpatient Hospital Stay: Payer: Medicare PPO | Attending: Hematology & Oncology

## 2022-04-19 VITALS — BP 156/60 | HR 62 | Temp 97.7°F | Resp 18 | Wt 272.4 lb

## 2022-04-19 DIAGNOSIS — D472 Monoclonal gammopathy: Secondary | ICD-10-CM

## 2022-04-19 DIAGNOSIS — Z79899 Other long term (current) drug therapy: Secondary | ICD-10-CM | POA: Insufficient documentation

## 2022-04-19 LAB — CBC WITH DIFFERENTIAL (CANCER CENTER ONLY)
Abs Immature Granulocytes: 0.01 10*3/uL (ref 0.00–0.07)
Basophils Absolute: 0 10*3/uL (ref 0.0–0.1)
Basophils Relative: 1 %
Eosinophils Absolute: 0.1 10*3/uL (ref 0.0–0.5)
Eosinophils Relative: 2 %
HCT: 43.8 % (ref 39.0–52.0)
Hemoglobin: 14.5 g/dL (ref 13.0–17.0)
Immature Granulocytes: 0 %
Lymphocytes Relative: 24 %
Lymphs Abs: 1.3 10*3/uL (ref 0.7–4.0)
MCH: 29.7 pg (ref 26.0–34.0)
MCHC: 33.1 g/dL (ref 30.0–36.0)
MCV: 89.6 fL (ref 80.0–100.0)
Monocytes Absolute: 0.3 10*3/uL (ref 0.1–1.0)
Monocytes Relative: 6 %
Neutro Abs: 3.7 10*3/uL (ref 1.7–7.7)
Neutrophils Relative %: 67 %
Platelet Count: 279 10*3/uL (ref 150–400)
RBC: 4.89 MIL/uL (ref 4.22–5.81)
RDW: 13.3 % (ref 11.5–15.5)
WBC Count: 5.5 10*3/uL (ref 4.0–10.5)
nRBC: 0 % (ref 0.0–0.2)

## 2022-04-19 LAB — CMP (CANCER CENTER ONLY)
ALT: 25 U/L (ref 0–44)
AST: 21 U/L (ref 15–41)
Albumin: 4.2 g/dL (ref 3.5–5.0)
Alkaline Phosphatase: 62 U/L (ref 38–126)
Anion gap: 8 (ref 5–15)
BUN: 20 mg/dL (ref 8–23)
CO2: 27 mmol/L (ref 22–32)
Calcium: 9.1 mg/dL (ref 8.9–10.3)
Chloride: 103 mmol/L (ref 98–111)
Creatinine: 1.2 mg/dL (ref 0.61–1.24)
GFR, Estimated: >60 mL/min
Glucose, Bld: 114 mg/dL — ABNORMAL HIGH (ref 70–99)
Potassium: 3.7 mmol/L (ref 3.5–5.1)
Sodium: 138 mmol/L (ref 135–145)
Total Bilirubin: 0.6 mg/dL (ref 0.3–1.2)
Total Protein: 7.2 g/dL (ref 6.5–8.1)

## 2022-04-19 LAB — LACTATE DEHYDROGENASE: LDH: 154 U/L (ref 98–192)

## 2022-04-19 NOTE — Progress Notes (Signed)
Hematology and Oncology Follow Up Visit  Paul Cardenas [id number] 09-Dec-1940 82 y.o. 04/19/2022   Principle Diagnosis:  IgG Kappa MGUS  Current Therapy:   Observation     Interim History:  Paul Cardenas is back for follow-up. He was last seen Dr. Marin Olp on 10/18/2021.   Paul Cardenas reports that he is doing well without any new or concerning symptoms. His energy levels are stable and he continues to complete his ADLs on his own. He denies any appetite or weight changes. He continues have sciatica involving his right leg which has improved with taking an OTC B complex. Additionally, the sciatica only minimally interferes with his quality of life. He denies nausea, vomiting or abdominal pain. His bowel habits are unchanged. He denies easy bruising or signs of active bleeding. He denies fevers, chills, sweats, shortness of breath, chest pain or cough. He has no other complaints.  Medications:  Current Outpatient Medications:    amLODipine (NORVASC) 5 MG tablet, Take 5 mg by mouth daily., Disp: , Rfl:    doxylamine, Sleep, (UNISOM SLEEPTABS) 25 MG tablet, Take by mouth., Disp: , Rfl:    ezetimibe (ZETIA) 10 MG tablet, Take 10 mg by mouth daily., Disp: , Rfl:    gabapentin (NEURONTIN) 100 MG capsule, Take 100 mg by mouth daily., Disp: , Rfl:    losartan-hydrochlorothiazide (HYZAAR) 100-25 MG tablet, Take 1 tablet by mouth daily., Disp: , Rfl:    montelukast (SINGULAIR) 10 MG tablet, Take 10 mg by mouth daily., Disp: , Rfl:    HYDROcodone-acetaminophen (NORCO/VICODIN) 5-325 MG tablet, 1-2 tabs PO q6 hours prn pain (Patient not taking: Reported on 04/19/2022), Disp: 10 tablet, Rfl: 0  Allergies:  Allergies  Allergen Reactions   Statins Other (See Comments)    Muscle Cramps     Past Medical History, Surgical history, Social history, and Family History were reviewed and updated.  Review of Systems: Review of Systems  Constitutional: Negative.   HENT:  Negative.    Eyes: Negative.   Respiratory:  Negative.    Cardiovascular: Negative.   Gastrointestinal: Negative.   Endocrine: Negative.   Genitourinary: Negative.    Musculoskeletal:  Positive for arthralgias and back pain.  Skin: Negative.   Neurological:  Positive for numbness.  Hematological: Negative.   Psychiatric/Behavioral: Negative.      Physical Exam:  weight is 272 lb 6.4 oz (123.6 kg). His oral temperature is 97.7 F (36.5 C). His blood pressure is 156/60 (abnormal) and his pulse is 62. His respiration is 18 and oxygen saturation is 96%.   Wt Readings from Last 3 Encounters:  04/19/22 272 lb 6.4 oz (123.6 kg)  10/18/21 265 lb (120.2 kg)  10/10/21 264 lb 8.8 oz (120 kg)    Physical Exam Vitals reviewed.  HENT:     Head: Normocephalic and atraumatic.  Eyes:     Pupils: Pupils are equal, round, and reactive to light.  Cardiovascular:     Rate and Rhythm: Normal rate and regular rhythm.     Heart sounds: Normal heart sounds.  Pulmonary:     Effort: Pulmonary effort is normal.     Breath sounds: Normal breath sounds.  Abdominal:     General: Bowel sounds are normal.     Palpations: Abdomen is soft.  Musculoskeletal:        General: No tenderness or deformity. Normal range of motion.     Cervical back: Normal range of motion.  Lymphadenopathy:     Cervical: No cervical adenopathy.  Skin:    General: Skin is warm and dry.     Findings: No erythema or rash.  Neurological:     Mental Status: He is alert and oriented to person, place, and time.  Psychiatric:        Behavior: Behavior normal.        Thought Content: Thought content normal.        Judgment: Judgment normal.      Lab Results  Component Value Date   WBC 5.5 04/19/2022   HGB 14.5 04/19/2022   HCT 43.8 04/19/2022   MCV 89.6 04/19/2022   PLT 279 04/19/2022     Chemistry      Component Value Date/Time   NA 138 04/19/2022 1418   K 3.7 04/19/2022 1418   CL 103 04/19/2022 1418   CO2 27 04/19/2022 1418   BUN 20 04/19/2022 1418    CREATININE 1.20 04/19/2022 1418      Component Value Date/Time   CALCIUM 9.1 04/19/2022 1418   ALKPHOS 62 04/19/2022 1418   AST 21 04/19/2022 1418   ALT 25 04/19/2022 1418   BILITOT 0.6 04/19/2022 1418      Impression and Plan: Paul Cardenas is a 82 y.o. male who returns for a follow up for IgG kappa MGUS.  Bone marrow biopsy from 09/30/2021 showed <10% plasma cells.   Labs from today reviewed with the patient. No evidence of cytopenias with CBC/diff that is unremarkable. Creatinine and calcium levels are normal. SPEP/IFE and serum free light chains are pending.   He has no clinical symptoms of transformation to multiple myeloma. We will wait for the remaining labs and follow up with patient by phone.   Continue on surveillance and return in 6 months with labs and follow up visit. He will be due for annual bone met survey in July 2024 to evaluate for lytic lesions.    Patient expressed understanding of the plan provided.   I have spent a total of 30 minutes minutes of face-to-face and non-face-to-face time, preparing to see the patient, performing a medically appropriate examination, counseling and educating the patient, documenting clinical information in the electronic health record, care coordination.   Dede Query PA-C Dept of Hematology and [place]

## 2022-04-20 LAB — KAPPA/LAMBDA LIGHT CHAINS
Kappa free light chain: 25.6 mg/L — ABNORMAL HIGH (ref 3.3–19.4)
Kappa, lambda light chain ratio: 1.94 — ABNORMAL HIGH (ref 0.26–1.65)
Lambda free light chains: 13.2 mg/L (ref 5.7–26.3)

## 2022-04-21 LAB — IGG, IGA, IGM
IgA: 297 mg/dL (ref 61–437)
IgG (Immunoglobin G), Serum: 1099 mg/dL (ref 603–1613)
IgM (Immunoglobulin M), Srm: 42 mg/dL (ref 15–143)

## 2022-04-28 LAB — PROTEIN ELECTROPHORESIS, SERUM, WITH REFLEX
A/G Ratio: 1.2 (ref 0.7–1.7)
Albumin ELP: 3.6 g/dL (ref 2.9–4.4)
Alpha-1-Globulin: 0.2 g/dL (ref 0.0–0.4)
Alpha-2-Globulin: 0.7 g/dL (ref 0.4–1.0)
Beta Globulin: 0.9 g/dL (ref 0.7–1.3)
Gamma Globulin: 1.2 g/dL (ref 0.4–1.8)
Globulin, Total: 3.1 g/dL (ref 2.2–3.9)
M-Spike, %: 0.5 g/dL — ABNORMAL HIGH
SPEP Interpretation: 0
Total Protein ELP: 6.7 g/dL (ref 6.0–8.5)

## 2022-04-28 LAB — IMMUNOFIXATION REFLEX, SERUM
IgA: 378 mg/dL (ref 61–437)
IgG (Immunoglobin G), Serum: 1377 mg/dL (ref 603–1613)
IgM (Immunoglobulin M), Srm: 53 mg/dL (ref 15–143)

## 2022-05-01 ENCOUNTER — Telehealth: Payer: Self-pay

## 2022-05-01 NOTE — Telephone Encounter (Signed)
Called and LM informing patient of lab results (ok per DPR). Requested a CB with any questions or concerns.   

## 2022-05-01 NOTE — Telephone Encounter (Signed)
-----   Message from Volanda Napoleon, MD sent at 05/01/2022 12:40 PM EDT ----- Call and let him know that everything is holding stable with his protein.  It is still low and stable.  Thanks.  Laurey Arrow

## 2022-05-30 ENCOUNTER — Telehealth (HOSPITAL_BASED_OUTPATIENT_CLINIC_OR_DEPARTMENT_OTHER): Payer: Self-pay

## 2022-05-31 ENCOUNTER — Telehealth (HOSPITAL_BASED_OUTPATIENT_CLINIC_OR_DEPARTMENT_OTHER): Payer: Self-pay

## 2022-06-15 ENCOUNTER — Ambulatory Visit (HOSPITAL_BASED_OUTPATIENT_CLINIC_OR_DEPARTMENT_OTHER)
Admission: RE | Admit: 2022-06-15 | Discharge: 2022-06-15 | Disposition: A | Discharge status: Home / Self Care | Payer: Medicare PPO | Source: Ambulatory Visit | Attending: Physician Assistant | Admitting: Physician Assistant

## 2022-06-15 DIAGNOSIS — D472 Monoclonal gammopathy: Secondary | ICD-10-CM | POA: Diagnosis present

## 2022-10-19 ENCOUNTER — Other Ambulatory Visit: Payer: Self-pay

## 2022-10-19 DIAGNOSIS — D472 Monoclonal gammopathy: Secondary | ICD-10-CM

## 2022-10-20 ENCOUNTER — Inpatient Hospital Stay: Payer: Medicare PPO | Admitting: Oncology

## 2022-10-20 ENCOUNTER — Encounter: Payer: Self-pay | Admitting: Oncology

## 2022-10-20 ENCOUNTER — Inpatient Hospital Stay: Payer: Medicare PPO | Attending: Hematology & Oncology

## 2022-10-20 DIAGNOSIS — D472 Monoclonal gammopathy: Secondary | ICD-10-CM | POA: Insufficient documentation

## 2022-10-20 DIAGNOSIS — M5431 Sciatica, right side: Secondary | ICD-10-CM | POA: Insufficient documentation

## 2022-10-20 DIAGNOSIS — R7989 Other specified abnormal findings of blood chemistry: Secondary | ICD-10-CM | POA: Insufficient documentation

## 2022-10-20 NOTE — Progress Notes (Deleted)
Hematology and Oncology Follow Up Visit  Paul Cardenas [id number] [birth year] 82 y.o. 10/20/2022   Principle Diagnosis:  IgG Kappa MGUS  Current Therapy:   Observation     Interim History:  Paul Cardenas is back for follow-up. He was last seen Dr. Myna Hidalgo on 10/18/2021.   Paul Cardenas reports that he is doing well without any new or concerning symptoms. His energy levels are stable and he continues to complete his ADLs on his own. He denies any appetite or weight changes. He continues have sciatica involving his right leg which has improved with taking an OTC B complex. Additionally, the sciatica only minimally interferes with his quality of life. He denies nausea, vomiting or abdominal pain. His bowel habits are unchanged. He denies easy bruising or signs of active bleeding. He denies fevers, chills, sweats, shortness of breath, chest pain or cough. He has no other complaints.  Medications:  Current Outpatient Medications:    amLODipine (NORVASC) 5 MG tablet, Take 5 mg by mouth daily., Disp: , Rfl:    doxylamine, Sleep, (UNISOM SLEEPTABS) 25 MG tablet, Take by mouth., Disp: , Rfl:    ezetimibe (ZETIA) 10 MG tablet, Take 10 mg by mouth daily., Disp: , Rfl:    gabapentin (NEURONTIN) 100 MG capsule, Take 100 mg by mouth daily., Disp: , Rfl:    losartan-hydrochlorothiazide (HYZAAR) 100-25 MG tablet, Take 1 tablet by mouth daily., Disp: , Rfl:    montelukast (SINGULAIR) 10 MG tablet, Take 10 mg by mouth daily., Disp: , Rfl:   Allergies:  Allergies  Allergen Reactions   Statins Other (See Comments)    Muscle Cramps     Past Medical History, Surgical history, Social history, and Family History were reviewed and updated.  Review of Systems: Review of Systems  Constitutional: Negative.   HENT:  Negative.    Eyes: Negative.   Respiratory: Negative.    Cardiovascular: Negative.   Gastrointestinal: Negative.   Endocrine: Negative.   Genitourinary: Negative.    Musculoskeletal:  Positive for  arthralgias and back pain.  Skin: Negative.   Neurological:  Positive for numbness.  Hematological: Negative.   Psychiatric/Behavioral: Negative.      Physical Exam:  vitals were not taken for this visit.   Wt Readings from Last 3 Encounters:  04/19/22 123.6 kg  10/18/21 120.2 kg  10/10/21 120 kg    Physical Exam Vitals reviewed.  HENT:     Head: Normocephalic and atraumatic.  Eyes:     Pupils: Pupils are equal, round, and reactive to light.  Cardiovascular:     Rate and Rhythm: Normal rate and regular rhythm.     Heart sounds: Normal heart sounds.  Pulmonary:     Effort: Pulmonary effort is normal.     Breath sounds: Normal breath sounds.  Abdominal:     General: Bowel sounds are normal.     Palpations: Abdomen is soft.  Musculoskeletal:        General: No tenderness or deformity. Normal range of motion.     Cervical back: Normal range of motion.  Lymphadenopathy:     Cervical: No cervical adenopathy.  Skin:    General: Skin is warm and dry.     Findings: No erythema or rash.  Neurological:     Mental Status: He is alert and oriented to person, place, and time.  Psychiatric:        Behavior: Behavior normal.        Thought Content: Thought content normal.  Judgment: Judgment normal.      Lab Results  Component Value Date   WBC 5.5 04/19/2022   HGB 14.5 04/19/2022   HCT 43.8 04/19/2022   MCV 89.6 04/19/2022   PLT 279 04/19/2022     Chemistry      Component Value Date/Time   NA 138 04/19/2022 1418   K 3.7 04/19/2022 1418   CL 103 04/19/2022 1418   CO2 27 04/19/2022 1418   BUN 20 04/19/2022 1418   CREATININE 1.20 04/19/2022 1418      Component Value Date/Time   CALCIUM 9.1 04/19/2022 1418   ALKPHOS 62 04/19/2022 1418   AST 21 04/19/2022 1418   ALT 25 04/19/2022 1418   BILITOT 0.6 04/19/2022 1418      Impression and Plan: Paul Cardenas is a 82 y.o. male who returns for a follow up for IgG kappa MGUS.  Bone marrow biopsy from 09/30/2021 showed  <10% plasma cells.   Labs from today reviewed with the patient. No evidence of cytopenias with CBC/diff that is unremarkable. Creatinine and calcium levels are normal. SPEP/IFE and serum free light chains are pending.   He has no clinical symptoms of transformation to multiple myeloma. We will wait for the remaining labs and follow up with patient by phone.   Continue on surveillance and return in 6 months with labs and follow up visit. He will be due for annual bone met survey in July 2024 to evaluate for lytic lesions.    Patient expressed understanding of the plan provided.   I have spent a total of 30 minutes minutes of face-to-face and non-face-to-face time, preparing to see the patient, performing a medically appropriate examination, counseling and educating the patient, documenting clinical information in the electronic health record, care coordination.   Georga Kaufmann PA-C Dept of Hematology and Oncology [facility]

## 2022-11-13 ENCOUNTER — Inpatient Hospital Stay: Payer: Medicare PPO

## 2022-11-13 ENCOUNTER — Encounter: Payer: Self-pay | Admitting: Medical Oncology

## 2022-11-13 ENCOUNTER — Inpatient Hospital Stay (HOSPITAL_BASED_OUTPATIENT_CLINIC_OR_DEPARTMENT_OTHER): Payer: Medicare PPO | Admitting: Medical Oncology

## 2022-11-13 VITALS — BP 123/65 | HR 88 | Temp 97.6°F | Resp 18 | Wt 275.0 lb

## 2022-11-13 DIAGNOSIS — M5431 Sciatica, right side: Secondary | ICD-10-CM | POA: Diagnosis not present

## 2022-11-13 DIAGNOSIS — D472 Monoclonal gammopathy: Secondary | ICD-10-CM | POA: Diagnosis present

## 2022-11-13 DIAGNOSIS — E860 Dehydration: Secondary | ICD-10-CM

## 2022-11-13 DIAGNOSIS — R7989 Other specified abnormal findings of blood chemistry: Secondary | ICD-10-CM | POA: Diagnosis not present

## 2022-11-13 DIAGNOSIS — E876 Hypokalemia: Secondary | ICD-10-CM | POA: Diagnosis not present

## 2022-11-13 DIAGNOSIS — G63 Polyneuropathy in diseases classified elsewhere: Secondary | ICD-10-CM

## 2022-11-13 LAB — CMP (CANCER CENTER ONLY)
ALT: 15 U/L (ref 0–44)
AST: 19 U/L (ref 15–41)
Albumin: 3.8 g/dL (ref 3.5–5.0)
Alkaline Phosphatase: 59 U/L (ref 38–126)
Anion gap: 10 (ref 5–15)
BUN: 15 mg/dL (ref 8–23)
CO2: 27 mmol/L (ref 22–32)
Calcium: 8.8 mg/dL — ABNORMAL LOW (ref 8.9–10.3)
Chloride: 103 mmol/L (ref 98–111)
Creatinine: 1.3 mg/dL — ABNORMAL HIGH (ref 0.61–1.24)
GFR, Estimated: 55 mL/min — ABNORMAL LOW (ref >60)
Glucose, Bld: 180 mg/dL — ABNORMAL HIGH (ref 70–99)
Potassium: 3.2 mmol/L — ABNORMAL LOW (ref 3.5–5.1)
Sodium: 140 mmol/L (ref 135–145)
Total Bilirubin: 0.8 mg/dL (ref 0.3–1.2)
Total Protein: 6.9 g/dL (ref 6.5–8.1)

## 2022-11-13 LAB — CBC WITH DIFFERENTIAL (CANCER CENTER ONLY)
Abs Immature Granulocytes: 0.02 10*3/uL (ref 0.00–0.07)
Basophils Absolute: 0.1 10*3/uL (ref 0.0–0.1)
Basophils Relative: 1 %
Eosinophils Absolute: 0.1 10*3/uL (ref 0.0–0.5)
Eosinophils Relative: 2 %
HCT: 42.8 % (ref 39.0–52.0)
Hemoglobin: 14.8 g/dL (ref 13.0–17.0)
Immature Granulocytes: 0 %
Lymphocytes Relative: 20 %
Lymphs Abs: 1.4 10*3/uL (ref 0.7–4.0)
MCH: 30.3 pg (ref 26.0–34.0)
MCHC: 34.6 g/dL (ref 30.0–36.0)
MCV: 87.5 fL (ref 80.0–100.0)
Monocytes Absolute: 0.4 10*3/uL (ref 0.1–1.0)
Monocytes Relative: 5 %
Neutro Abs: 5.1 10*3/uL (ref 1.7–7.7)
Neutrophils Relative %: 72 %
Platelet Count: 258 10*3/uL (ref 150–400)
RBC: 4.89 MIL/uL (ref 4.22–5.81)
RDW: 13.5 % (ref 11.5–15.5)
WBC Count: 7.1 10*3/uL (ref 4.0–10.5)
nRBC: 0 % (ref 0.0–0.2)

## 2022-11-13 LAB — LACTATE DEHYDROGENASE: LDH: 149 U/L (ref 98–192)

## 2022-11-13 NOTE — Progress Notes (Signed)
Hematology and Oncology Follow Up Visit  Paul Cardenas [id number] [birth year] 82 y.o. 11/13/2022   Principle Diagnosis:  IgG Kappa MGUS  Current Therapy:   Observation     Interim History:  Paul Cardenas is back for follow-up. He was last seen by our office on 04/19/2022 by Georga Kaufmann PA-C.   He denies any appetite or weight changes.  Has some sciatica of the right leg which has been improved with B vitamin supplements.  He reports that he didn't eat the best or hydrate the best yesterday due to some car troubles. He has now gotten his food supply back and is planning on resuming his normal routine of ice cream with bananas and hydration with water. He is otherwise feeling well.  He denies nausea, vomiting or abdominal pain. His bowel habits are unchanged. He denies easy bruising or signs of active bleeding. He denies fevers, chills, sweats, shortness of breath, chest pain or cough. He has no other complaints. Wt Readings from Last 3 Encounters:  11/13/22 275 lb 0.6 oz (124.8 kg)  04/19/22 272 lb 6.4 oz (123.6 kg)  10/18/21 265 lb (120.2 kg)    Medications:  Current Outpatient Medications:    amLODipine (NORVASC) 5 MG tablet, Take 5 mg by mouth daily., Disp: , Rfl:    doxylamine, Sleep, (UNISOM SLEEPTABS) 25 MG tablet, Take by mouth., Disp: , Rfl:    ezetimibe (ZETIA) 10 MG tablet, Take 10 mg by mouth daily., Disp: , Rfl:    gabapentin (NEURONTIN) 100 MG capsule, Take 100 mg by mouth daily., Disp: , Rfl:    losartan-hydrochlorothiazide (HYZAAR) 100-25 MG tablet, Take 1 tablet by mouth daily., Disp: , Rfl:    montelukast (SINGULAIR) 10 MG tablet, Take 10 mg by mouth daily. (Patient not taking: Reported on 11/13/2022), Disp: , Rfl:   Allergies:  Allergies  Allergen Reactions   Statins Other (See Comments)    Muscle Cramps     Past Medical History, Surgical history, Social history, and Family History were reviewed and updated.  Review of Systems: Review of Systems  Constitutional:  Negative.   HENT:  Negative.    Eyes: Negative.   Respiratory: Negative.    Cardiovascular: Negative.   Gastrointestinal: Negative.   Endocrine: Negative.   Genitourinary: Negative.    Musculoskeletal:  Positive for arthralgias and back pain.  Skin: Negative.   Neurological:  Positive for numbness.  Hematological: Negative.   Psychiatric/Behavioral: Negative.      Physical Exam:  weight is 275 lb 0.6 oz (124.8 kg). His oral temperature is 97.6 F (36.4 C). His blood pressure is 123/65 and his pulse is 88. His respiration is 18 and oxygen saturation is 98%.   Wt Readings from Last 3 Encounters:  11/13/22 275 lb 0.6 oz (124.8 kg)  04/19/22 272 lb 6.4 oz (123.6 kg)  10/18/21 265 lb (120.2 kg)    Physical Exam Vitals reviewed.  HENT:     Head: Normocephalic and atraumatic.  Eyes:     Pupils: Pupils are equal, round, and reactive to light.  Cardiovascular:     Rate and Rhythm: Normal rate and regular rhythm.     Heart sounds: Normal heart sounds.  Pulmonary:     Effort: Pulmonary effort is normal.     Breath sounds: Normal breath sounds.  Abdominal:     General: Bowel sounds are normal.     Palpations: Abdomen is soft.  Musculoskeletal:        General: No tenderness or deformity. Normal  range of motion.     Cervical back: Normal range of motion.  Lymphadenopathy:     Cervical: No cervical adenopathy.  Skin:    General: Skin is warm and dry.     Findings: No erythema or rash.  Neurological:     Mental Status: He is alert and oriented to person, place, and time.  Psychiatric:        Behavior: Behavior normal.        Thought Content: Thought content normal.        Judgment: Judgment normal.      Lab Results  Component Value Date   WBC 7.1 11/13/2022   HGB 14.8 11/13/2022   HCT 42.8 11/13/2022   MCV 87.5 11/13/2022   PLT 258 11/13/2022     Chemistry      Component Value Date/Time   NA 140 11/13/2022 1010   K 3.2 (L) 11/13/2022 1010   CL 103 11/13/2022  1010   CO2 27 11/13/2022 1010   BUN 15 11/13/2022 1010   CREATININE 1.30 (H) 11/13/2022 1010      Component Value Date/Time   CALCIUM 8.8 (L) 11/13/2022 1010   ALKPHOS 59 11/13/2022 1010   AST 19 11/13/2022 1010   ALT 15 11/13/2022 1010   BILITOT 0.8 11/13/2022 1010      Impression and Plan: Paul Cardenas is a 82 y.o. male who returns for a follow up for IgG kappa MGUS.  Bone marrow biopsy from 09/30/2021 showed <10% plasma cells.   Labs from today reviewed with the patient. CBC w/ is stable and unremarkable. Creatinine is slightly elevated at 1.3. Calcium is down slightly to 8.8 today. All of these likely secondary to him not eating well and not hydrating well today and yesterday. He will go home and eat/drink as we discussed. LDH is stable at 149. SPEP/IFE and serum free light chains are pending.   Continue on surveillance and return in 6 months with labs and follow up visit. He had his annual bone met survey in July 2024 to evaluate for lytic lesions- this imaging study did not show any evidence of lytic lesions of the bones.   Patient expressed understanding of the plan provided.   I have spent a total of 30 minutes minutes of face-to-face and non-face-to-face time, preparing to see the patient, performing a medically appropriate examination, counseling and educating the patient, documenting clinical information in the electronic health record, care coordination.   Clent Jacks PA-C Dept of Hematology and Oncology [facility]

## 2022-11-14 LAB — KAPPA/LAMBDA LIGHT CHAINS
Kappa free light chain: 27.3 mg/L — ABNORMAL HIGH (ref 3.3–19.4)
Kappa, lambda light chain ratio: 2.07 — ABNORMAL HIGH (ref 0.26–1.65)
Lambda free light chains: 13.2 mg/L (ref 5.7–26.3)

## 2022-11-14 LAB — IGG, IGA, IGM
IgA: 302 mg/dL (ref 61–437)
IgG (Immunoglobin G), Serum: 1082 mg/dL (ref 603–1613)
IgM (Immunoglobulin M), Srm: 44 mg/dL (ref 15–143)

## 2022-11-14 LAB — BETA 2 MICROGLOBULIN, SERUM: Beta-2 Microglobulin: 2.7 mg/L — ABNORMAL HIGH (ref 0.6–2.4)

## 2022-11-15 LAB — PROTEIN ELECTROPHORESIS, SERUM
A/G Ratio: 1 (ref 0.7–1.7)
Albumin ELP: 3.3 g/dL (ref 2.9–4.4)
Alpha-1-Globulin: 0.3 g/dL (ref 0.0–0.4)
Alpha-2-Globulin: 0.8 g/dL (ref 0.4–1.0)
Beta Globulin: 0.9 g/dL (ref 0.7–1.3)
Gamma Globulin: 1.2 g/dL (ref 0.4–1.8)
Globulin, Total: 3.2 g/dL (ref 2.2–3.9)
M-Spike, %: 0.6 g/dL — ABNORMAL HIGH
Total Protein ELP: 6.5 g/dL (ref 6.0–8.5)
